# Patient Record
Sex: Female | Born: 1975
Health system: Southern US, Community
[De-identification: ages and names within clinical notes are randomized; demographics above are authoritative.]

## PROBLEM LIST (undated history)

## (undated) DIAGNOSIS — J189 Pneumonia, unspecified organism: Secondary | ICD-10-CM

## (undated) DIAGNOSIS — K5190 Ulcerative colitis, unspecified, without complications: Secondary | ICD-10-CM

## (undated) DIAGNOSIS — Z87442 Personal history of urinary calculi: Secondary | ICD-10-CM

## (undated) DIAGNOSIS — R0600 Dyspnea, unspecified: Secondary | ICD-10-CM

## (undated) DIAGNOSIS — J310 Chronic rhinitis: Secondary | ICD-10-CM

## (undated) DIAGNOSIS — Z8669 Personal history of other diseases of the nervous system and sense organs: Secondary | ICD-10-CM

## (undated) DIAGNOSIS — N3010 Interstitial cystitis (chronic) without hematuria: Secondary | ICD-10-CM

## (undated) DIAGNOSIS — E785 Hyperlipidemia, unspecified: Secondary | ICD-10-CM

## (undated) DIAGNOSIS — J329 Chronic sinusitis, unspecified: Secondary | ICD-10-CM

## (undated) DIAGNOSIS — J40 Bronchitis, not specified as acute or chronic: Secondary | ICD-10-CM

## (undated) DIAGNOSIS — R599 Enlarged lymph nodes, unspecified: Secondary | ICD-10-CM

## (undated) DIAGNOSIS — K219 Gastro-esophageal reflux disease without esophagitis: Secondary | ICD-10-CM

## (undated) DIAGNOSIS — E0590 Thyrotoxicosis, unspecified without thyrotoxic crisis or storm: Secondary | ICD-10-CM

## (undated) HISTORY — DX: Gastro-esophageal reflux disease without esophagitis: K21.9

## (undated) HISTORY — PX: UPPER GASTROINTESTINAL ENDOSCOPY: SHX188

## (undated) HISTORY — PX: BLADDER SURGERY: SHX569

## (undated) HISTORY — DX: Hyperlipidemia, unspecified: E78.5

## (undated) HISTORY — DX: Enlarged lymph nodes, unspecified: R59.9

## (undated) HISTORY — DX: Chronic rhinitis: J31.0

## (undated) HISTORY — DX: Chronic sinusitis, unspecified: J32.9

## (undated) HISTORY — DX: Personal history of other diseases of the nervous system and sense organs: Z86.69

## (undated) HISTORY — DX: Interstitial cystitis (chronic) without hematuria: N30.10

## (undated) HISTORY — PX: TONSILECTOMY, ADENOIDECTOMY, BILATERAL MYRINGOTOMY AND TUBES: SHX2538

## (undated) HISTORY — DX: Bronchitis, not specified as acute or chronic: J40

## (undated) HISTORY — PX: COLONOSCOPY: SHX174

---

## 1997-09-04 ENCOUNTER — Other Ambulatory Visit: Admission: RE | Admit: 1997-09-04 | Discharge: 1997-09-04 | Payer: Self-pay | Admitting: *Deleted

## 1998-09-07 ENCOUNTER — Other Ambulatory Visit: Admission: RE | Admit: 1998-09-07 | Discharge: 1998-09-07 | Payer: Self-pay | Admitting: *Deleted

## 1998-09-24 ENCOUNTER — Ambulatory Visit (HOSPITAL_COMMUNITY): Admission: RE | Admit: 1998-09-24 | Discharge: 1998-09-24 | Payer: Self-pay | Admitting: *Deleted

## 1998-10-25 ENCOUNTER — Inpatient Hospital Stay (HOSPITAL_COMMUNITY): Admission: AD | Admit: 1998-10-25 | Discharge: 1998-10-25 | Payer: Self-pay | Admitting: Obstetrics & Gynecology

## 1999-03-13 ENCOUNTER — Emergency Department (HOSPITAL_COMMUNITY): Admission: EM | Admit: 1999-03-13 | Discharge: 1999-03-14 | Payer: Self-pay | Admitting: Emergency Medicine

## 1999-03-13 ENCOUNTER — Inpatient Hospital Stay (HOSPITAL_COMMUNITY): Admission: AD | Admit: 1999-03-13 | Discharge: 1999-03-13 | Payer: Self-pay | Admitting: Obstetrics & Gynecology

## 1999-09-09 ENCOUNTER — Emergency Department (HOSPITAL_COMMUNITY): Admission: EM | Admit: 1999-09-09 | Discharge: 1999-09-10 | Payer: Self-pay | Admitting: Internal Medicine

## 1999-09-09 ENCOUNTER — Encounter: Payer: Self-pay | Admitting: Internal Medicine

## 1999-09-15 ENCOUNTER — Other Ambulatory Visit: Admission: RE | Admit: 1999-09-15 | Discharge: 1999-09-15 | Payer: Self-pay | Admitting: *Deleted

## 2000-09-27 ENCOUNTER — Other Ambulatory Visit: Admission: RE | Admit: 2000-09-27 | Discharge: 2000-09-27 | Payer: Self-pay

## 2000-11-10 ENCOUNTER — Other Ambulatory Visit: Admission: RE | Admit: 2000-11-10 | Discharge: 2000-11-10 | Payer: Self-pay | Admitting: Obstetrics and Gynecology

## 2000-11-10 ENCOUNTER — Encounter (INDEPENDENT_AMBULATORY_CARE_PROVIDER_SITE_OTHER): Payer: Self-pay

## 2001-03-07 ENCOUNTER — Other Ambulatory Visit: Admission: RE | Admit: 2001-03-07 | Discharge: 2001-03-07 | Payer: Self-pay | Admitting: Obstetrics and Gynecology

## 2001-06-13 HISTORY — PX: DILATION AND CURETTAGE OF UTERUS: SHX78

## 2001-06-19 ENCOUNTER — Other Ambulatory Visit: Admission: RE | Admit: 2001-06-19 | Discharge: 2001-06-19 | Payer: Self-pay | Admitting: Obstetrics and Gynecology

## 2001-10-02 ENCOUNTER — Other Ambulatory Visit: Admission: RE | Admit: 2001-10-02 | Discharge: 2001-10-02 | Payer: Self-pay | Admitting: Orthopedic Surgery

## 2002-03-25 ENCOUNTER — Ambulatory Visit (HOSPITAL_BASED_OUTPATIENT_CLINIC_OR_DEPARTMENT_OTHER): Admission: RE | Admit: 2002-03-25 | Discharge: 2002-03-25 | Payer: Self-pay | Admitting: *Deleted

## 2002-04-22 ENCOUNTER — Other Ambulatory Visit: Admission: RE | Admit: 2002-04-22 | Discharge: 2002-04-22 | Payer: Self-pay | Admitting: Obstetrics and Gynecology

## 2002-10-18 ENCOUNTER — Other Ambulatory Visit: Admission: RE | Admit: 2002-10-18 | Discharge: 2002-10-18 | Payer: Self-pay | Admitting: Obstetrics and Gynecology

## 2003-07-11 ENCOUNTER — Inpatient Hospital Stay (HOSPITAL_COMMUNITY): Admission: RE | Admit: 2003-07-11 | Discharge: 2003-07-13 | Payer: Self-pay | Admitting: Obstetrics and Gynecology

## 2003-08-08 ENCOUNTER — Other Ambulatory Visit: Admission: RE | Admit: 2003-08-08 | Discharge: 2003-08-08 | Payer: Self-pay | Admitting: Obstetrics and Gynecology

## 2004-05-26 ENCOUNTER — Ambulatory Visit (HOSPITAL_COMMUNITY): Admission: RE | Admit: 2004-05-26 | Discharge: 2004-05-26 | Payer: Self-pay | Admitting: Allergy

## 2005-04-06 ENCOUNTER — Ambulatory Visit: Payer: Self-pay | Admitting: Pulmonary Disease

## 2005-04-08 ENCOUNTER — Ambulatory Visit: Payer: Self-pay | Admitting: Cardiology

## 2005-04-29 ENCOUNTER — Ambulatory Visit: Payer: Self-pay | Admitting: Pulmonary Disease

## 2005-05-27 ENCOUNTER — Ambulatory Visit: Payer: Self-pay | Admitting: Internal Medicine

## 2005-05-31 ENCOUNTER — Ambulatory Visit: Payer: Self-pay | Admitting: Pulmonary Disease

## 2005-07-01 ENCOUNTER — Ambulatory Visit: Payer: Self-pay | Admitting: Emergency Medicine

## 2005-08-29 ENCOUNTER — Ambulatory Visit: Payer: Self-pay | Admitting: Internal Medicine

## 2005-11-14 ENCOUNTER — Ambulatory Visit: Payer: Self-pay | Admitting: Pulmonary Disease

## 2005-11-24 ENCOUNTER — Ambulatory Visit: Payer: Self-pay | Admitting: Gastroenterology

## 2006-04-14 ENCOUNTER — Ambulatory Visit: Payer: Self-pay | Admitting: Pulmonary Disease

## 2006-04-28 ENCOUNTER — Ambulatory Visit: Payer: Self-pay | Admitting: Pulmonary Disease

## 2006-07-28 ENCOUNTER — Ambulatory Visit: Payer: Self-pay | Admitting: Pulmonary Disease

## 2007-09-13 ENCOUNTER — Telehealth (INDEPENDENT_AMBULATORY_CARE_PROVIDER_SITE_OTHER): Payer: Self-pay | Admitting: *Deleted

## 2007-09-27 ENCOUNTER — Encounter: Payer: Self-pay | Admitting: Adult Health

## 2007-09-27 DIAGNOSIS — T7840XA Allergy, unspecified, initial encounter: Secondary | ICD-10-CM | POA: Insufficient documentation

## 2007-09-27 DIAGNOSIS — J45909 Unspecified asthma, uncomplicated: Secondary | ICD-10-CM | POA: Insufficient documentation

## 2007-10-02 ENCOUNTER — Telehealth (INDEPENDENT_AMBULATORY_CARE_PROVIDER_SITE_OTHER): Payer: Self-pay | Admitting: *Deleted

## 2007-10-02 ENCOUNTER — Ambulatory Visit: Payer: Self-pay | Admitting: Internal Medicine

## 2008-07-26 ENCOUNTER — Ambulatory Visit (HOSPITAL_COMMUNITY): Admission: RE | Admit: 2008-07-26 | Discharge: 2008-07-26 | Payer: Self-pay | Admitting: Orthopedic Surgery

## 2010-04-13 ENCOUNTER — Ambulatory Visit (HOSPITAL_COMMUNITY): Admission: RE | Admit: 2010-04-13 | Discharge: 2010-04-13 | Payer: Self-pay | Admitting: Allergy

## 2010-07-15 NOTE — Progress Notes (Signed)
Summary: req rx.Marland KitchenMarland KitchenMarland KitchenMarland Kitchenrequested chart...  Phone Note Call from Patient   Caller: Patient Call For: tammy parrett Summary of Call: pt req xopenex inhaler. almost out. no refills left.  [facility] [address] pt [phone]. Initial call taken by: Cooper Render,  September 13, 2007 9:37 AM  Follow-up for Phone Call        chart not preloaded...Marland KitchenMarland KitchenMarland Kitchenrequested chart....awaiting chart before addressing this message.  Follow-up by: Valrie Hart LPN,  September 12, 8848 10:02 AM  Additional Follow-up for Phone Call Additional follow up Details #1::        spoke with pt. pt is a former pt of dr. Patsey Berthold and has not been seen in our office since feb 2008. scheduled pt a consult appt with dr. Chase Caller for 10-02-2007 @ 10:40 to re-establish with practice. called in x[facility]ex hfa [facility] [address] with one refill only.  pt aware.  Additional Follow-up by: Valrie Hart LPN,  September 12, 2772 10:48 AM    New/Updated Medications: XOPENEX HFA 45 MCG/ACT  AERO (LEVALBUTEROL TARTRATE) inhale 1 to 2 puffs every 6 hours as needed   Prescriptions: XOPENEX HFA 45 MCG/ACT  AERO (LEVALBUTEROL TARTRATE) inhale 1 to 2 puffs every 6 hours as needed  #1 x 0   Entered by:   Valrie Hart LPN   Authorized by:   Rexene Edison NP   Signed by:   Valrie Hart LPN on 12/87/8676   Method used:   Telephoned to ...       [facility]       [place], [place]  [address]       Ph: [phone]       Fax: [phone]   RxID:   [id number]

## 2010-07-15 NOTE — Assessment & Plan Note (Signed)
Summary: former pt of dr. Sharyne Peach with practice./mg   Visit Type:  Follow-up PCP:  Dr Samara Snide  Chief Complaint:  asthmapulmonary consult.....Marland Kitchenasthma......Marland Kitchenprev pt of dr. Duwayne Heck.  History of Present Illness: 35 year old female. Used to be followed up by Dr. Veda Canning for asthma. Now here because she wants her inhalers refilled.   Has had long standing asthma (see past med). Lsat prednisone course 07/2007 and returned to baseline.  However, for past 1 month more dyspneic and using rescure now once a day. Waking up at least once a night and using albuerol for rescue.      Updated Prior Medication List: XOPENEX HFA 45 MCG/ACT  AERO (LEVALBUTEROL TARTRATE) inhale 1 to 2 puffs every 6 hours as needed ZYRTEC ALLERGY 10 MG  TABS (CETIRIZINE HCL) once daily PHENTERMINE HCL 15 MG  CAPS (PHENTERMINE HCL) once daily  Current Allergies (reviewed today): ! * LATEX ALLERGY  Past Medical History:    Denies Vocal Cord Dysfunction    MIGRAINES, HX OF (ICD-V13.8)    ALLERGY (ICD-995.3) x Hay Fever    ASTHMA (ICD-493.90) x since age 18. Mulitple ER visits. Last ER visit 2002. Last [facility] visit 2008. Been to Urgent care 5 times since 2006. 10 x 2 week pred bursts since 2006. Last pred course 07/2007. No hospitalization. No ICU admits. Between exacerbations typically uses albuterol uses 2 times per week. Always gets 3-4 exacerbations per year. Exacerbations provoked by weather change, cold, uri, sore throat, dust, pollen, some perfumes, fuel, smog, burning brush, exercise. Typical symptoms of asthma are dysponea, chest tightness and occassional wheeze. Medication regimen as of 09/2007 does not inlcude LABA and ICS. Only PFTs done was at age 30 at 70.        Family History:    COPD--grandfather    allergies--grandfather    asthma--grandfather    cancer--grandmother  Social History:    married and have 2 children    lives with husband and children    1 dog (poodle). No cats. 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    Risk Factors:  Tobacco use:  never   Review of Systems  The patient denies anorexia, fever, weight loss, weight gain, vision loss, decreased hearing, hoarseness, chest pain, syncope, dyspnea on exhertion, peripheral edema, prolonged cough, hemoptysis, abdominal pain, melena, hematochezia, severe indigestion/heartburn, hematuria, incontinence, genital sores, muscle weakness, suspicious skin lesions, transient blindness, difficulty walking, depression, unusual weight change, abnormal bleeding, enlarged lymph nodes, angioedema, breast masses, and testicular masses.         occ clearning of throat Normal menstrual cycles, Denies pregnancy   Vital Signs:  Patient Profile:   35 Years Old Female Height:     65 inches Weight:      156 pounds BMI:     26.05 O2 Sat:      98 % O2 treatment:    Room Air Temp:     99.9 degrees F oral Pulse rate:   100 / minute BP sitting:   110 / 70  (left arm) Cuff size:   regular  Pt. in pain?   no  Vitals Entered By: Marcy Siren Deborra Medina) (October 02, 2007 10:50 AM)                  Physical Exam  General:     well developed, well nourished, in no acute distress Head:     normocephalic and atraumatic Eyes:  PERRLA/EOM intact; conjunctiva and sclera clear Ears:     TMs intact and clear with normal canals Nose:     no deformity, discharge, inflammation, or lesions Mouth:     no deformity or lesions Neck:     no masses, thyromegaly, or abnormal cervical nodes Chest Wall:     no deformities noted Lungs:     clear bilaterally to auscultation and percussion Heart:     regular rate and rhythm, S1, S2 without murmurs, rubs, gallops, or clicks Abdomen:     bowel sounds positive; abdomen soft and non-tender without masses, or organomegaly Msk:     no deformity or scoliosis noted with normal posture Pulses:     pulses normal Extremities:     no clubbing,  cyanosis, edema, or deformity noted Neurologic:     CN II-XII grossly intact with normal reflexes, coordination, muscle strength and tone Skin:     intact without lesions or rashes Cervical Nodes:     no significant adenopathy Psych:     alert and cooperative; normal mood and affect; normal attention span and concentration     Problem # 1:  ASTHMA (ICD-493.90) Assessment: New 35 year old non smoker with diagnosis of asthma since age 51. Has all classic trigger factors. High Risk asthma as characterized by multiple [place] visits, > 3-4 exacerbations per year needing prednsione. Between exacerbations is NOT on ICS/LABA treatment. Bsaeline lung fucntion unknown. Currently, has symptoms in the "NOT WELL CONTROLLED" class. However, office Spirometry is NORMAL.   Suspect Overperceiver, ? has asthma, ? normal spirometry is poor for her.  PLAN Methacholine challenge test (denies prengancy)   Medications Added to Medication List This Visit: 1)  Zyrtec Allergy 10 Mg Tabs (Cetirizine hcl) .... Once daily 2)  Phentermine Hcl 15 Mg Caps (Phentermine hcl) .... Once daily   Patient Instructions: 1)  Have methacholine challenge test 2)  Return for followup after that 3)  You cannot be pregnant for this test 4)  Till then take xopenex as needed 5)  If any acout worsening, please come to urgen care at our office for spirometry    ]  SPIROMETRY RESULTS  Date:10/02/2007 height inches: 65 Gender: Female  Parameter  Measured Predicted %Predicted  FVC      4.11        3.84      107  FEV1      3.29        3.27      101  FEV1/FVC      0.80        0.85      94  FEF25-75      3.11        3.78      82  PEF              446      0  Post-Bronchodilator  Parameter Measured Change from Baseline  FVC               FEV1               FEV1/FVC                FEF25-75                PEF              Normal Flow-Volume loop.    Pulse Oximetry Pulse: 100 O2 Saturation %: 98

## 2010-07-15 NOTE — Progress Notes (Signed)
Summary: NEED TO SPEAK TO NURSE ABOUT OV TODAY AND REFILLS  Phone Note Call from Patient   Caller: Patient Call For: PARRETT Summary of Call: WANT TO SPEAK TO TAMMY ABOUT OV APPOINTMENT TODAY AND HER REFILLS PATIENT'S CHART HAS BEEN REQUESTED   Initial call taken by: Gustavus Bryant,  October 02, 2007 3:14 PM  Follow-up for Phone Call        Spoke with pt. She was very upset that she was told today that she may not even have asthma.  She states that she has been a chronic asthmatic since she was 35 yrs old and she does not think that she needs MCT.  She is refusing to have test done and would like to switch Dr.s. Please advise. Thanks :) Follow-up by: Tilden Dome,  October 02, 2007 3:45 PM  Additional Follow-up for Phone Call Additional follow up Details #1::        I told her that asthma is still possible but might not be possible as she could have outgrown it as well. Her spirometry in face of lot of symptoms were normal in office yesterday. Before committing to lot of inahler treatment I thought it would be better for her to have methacholine challenge test. I just cannot commit her to lot of inhaler therapy in faceo of normal spirometry. We need better proof. ...................................................................Brand Males MD  October 03, 2007 2:26 PM  Additional Follow-up by: Brand Males MD,  October 03, 2007 2:26 PM    Additional Follow-up for Phone Call Additional follow up Details #2::    I just called her home phone to personally talk and clarify. Have asked her to call back. Please page me so I can talk to her and clarify. If she still wants to switch and does not want to talk to me that is fine. ...................................................................Brand Males MD  October 03, 2007 2:29 PM  Follow-up by: Brand Males MD,  October 03, 2007 2:29 PM  Additional Follow-up for Phone Call Additional follow up Details #3:: Details for Additional  Follow-up Action Taken: one option to see anothe rpulmonologist is Dr. Katherine Roan in town [place]  Oct 12, 2007 2:52 PM  no call back since 10/02/07   Additional Follow-up by: Brand Males MD,  October 04, 2007 9:25 AM

## 2010-09-13 ENCOUNTER — Other Ambulatory Visit (HOSPITAL_COMMUNITY): Payer: Self-pay | Admitting: Family Medicine

## 2010-09-13 DIAGNOSIS — R1013 Epigastric pain: Secondary | ICD-10-CM

## 2010-09-13 DIAGNOSIS — R110 Nausea: Secondary | ICD-10-CM

## 2010-09-14 ENCOUNTER — Ambulatory Visit (HOSPITAL_COMMUNITY)
Admission: RE | Admit: 2010-09-14 | Discharge: 2010-09-14 | Disposition: A | Discharge status: Home / Self Care | Payer: 59 | Source: Ambulatory Visit | Attending: Family Medicine | Admitting: Family Medicine

## 2010-09-14 DIAGNOSIS — R110 Nausea: Secondary | ICD-10-CM | POA: Insufficient documentation

## 2010-09-14 DIAGNOSIS — R1013 Epigastric pain: Secondary | ICD-10-CM | POA: Insufficient documentation

## 2010-10-26 ENCOUNTER — Encounter: Payer: Self-pay | Admitting: Internal Medicine

## 2010-10-26 ENCOUNTER — Ambulatory Visit (INDEPENDENT_AMBULATORY_CARE_PROVIDER_SITE_OTHER): Payer: Commercial Managed Care - PPO | Admitting: Internal Medicine

## 2010-10-26 VITALS — BP 110/68 | HR 64 | Ht 65.0 in | Wt 157.2 lb

## 2010-10-26 DIAGNOSIS — R1013 Epigastric pain: Secondary | ICD-10-CM | POA: Insufficient documentation

## 2010-10-26 DIAGNOSIS — R112 Nausea with vomiting, unspecified: Secondary | ICD-10-CM | POA: Insufficient documentation

## 2010-10-26 NOTE — Progress Notes (Signed)
Subjective:    Patient ID: [name], female    DOB: [birth year], 35 y.o.   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 35 years or more, she has had episodic achy epigastric pain followed by nausea and vomiting. It usually a nocturnal problem. Despite waiting several hours before going to bed, if she eats spicy or very flavorful foods she will experience epigastric aching and subsequent nausea and vomiting. She does not seem to vomit undigested food. It is not associated with her chronic migraines. She had an EGD in 2006 that was normal. She's had 2 ultrasounds in the past several years that are normal. She uses metoclopramide intermittently with partial benefit. She uses it several times a month and she is aware of the potential side effects. He has not lost weight. Bowel habits are regular. It does not seem to be related to her interstitial cystitis problems either. She does describe a fair amount of early satiety issues and says she does not eat much. She has not using it now there is a history of phentermine use  Past Medical History  Diagnosis Date  . GERD (gastroesophageal reflux disease)   . History of migraine headaches   . Rhinosinusitis   . Asthma   . Bronchitis   . Interstitial cystitis   . Hyperlipidemia    Past Surgical History  Procedure Date  . Dilation and curettage of uterus 2003  . Cesarean section [id number]    x2  . Upper gastrointestinal endoscopy 05/11/2005    Normal - Dr. Evette Cristal    reports that she has never smoked. She has never used smokeless tobacco. She reports that she does not drink alcohol or use illicit drugs. family history includes Allergies in an unspecified family member; Asthma in her maternal grandfather; Breast cancer in her maternal grandmother; COPD in an unspecified family member; Heart disease in her maternal grandmother; and Lymphoma in her maternal grandmother. Allergies  Allergen Reactions    . Latex       Current outpatient prescriptions:azelastine (ASTEPRO) 137 MCG/SPRAY nasal spray, 1 spray by Nasal route 2 (two) times daily. Use in each nostril as directed , Disp: , Rfl: ;  budesonide-formoterol (SYMBICORT) 160-4.5 MCG/ACT inhaler, Inhale 2 puffs into the lungs 2 (two) times daily.  , Disp: , Rfl: ;  hydrOXYzine (ATARAX) 25 MG tablet, Take 25 mg by mouth 3 (three) times daily as needed.  , Disp: , Rfl:  levalbuterol (XOPENEX) 1.25 MG/3ML nebulizer solution, Take 1 ampule by nebulization every 4 (four) hours as needed.  , Disp: , Rfl: ;  Loratadine (CLARITIN) 10 MG CAPS, Take 1 capsule by mouth daily.  , Disp: , Rfl: ;  metoCLOPramide (REGLAN) 10 MG tablet, Take 10 mg by mouth as needed.  , Disp: , Rfl: ;  mometasone (NASONEX) 50 MCG/ACT nasal spray, 2 sprays by Nasal route daily.  , Disp: , Rfl:  montelukast (SINGULAIR) 10 MG tablet, Take 10 mg by mouth at bedtime.  , Disp: , Rfl: ;  pantoprazole (PROTONIX) 40 MG tablet, Take 40 mg by mouth daily.  , Disp: , Rfl:    Review of Systems Allergies and sinus problems, asthma though this appears stable and controlled, interstitial cystitis with intermittent bladder symptoms. All the review of systems negative.    Objective:   Physical Exam General: Well-developed, well-nourished and in no acute distress Eyes:anicteric. Mouth and posterior pharynx: normal.  Neck: supple w/o thyromegaly or mass.  Lungs: clear.  Heart: S1S2, no rubs, murmurs, gallops. Abdomen: soft, non-tender, no hepatosplenomegaly, hernia, or mass and BS+. No splash. Lymphatics: no cervical,or [place]  nodes. Extremities:  no edema Skin no rash. Neuro: nonfocal. A&O x 3.  Psych: appropriate mood and  affect.       Assessment & Plan:

## 2010-10-26 NOTE — Assessment & Plan Note (Addendum)
Chronic recurrent achy epigastric pain despite PPI. EGD normal in 2006. This proceeds her nausea and vomiting episodes. She is well in between. I am most suspicious of a functional GI process. Await the gastric emptying study.

## 2010-10-26 NOTE — Assessment & Plan Note (Signed)
This is a chronic recurrent issue episodic. It sounds like she has a gastric emptying disturbance. EGD in 2006 was negative, ultrasound then and now is unrevealing as well. She also has significant early satiety. We'll start with a gastric emptying study. Consider EGD with biopsies given her allergy issues, she could have eosinophilic gastroenteritis that seems less likely. It does not seem to have a relationship with her migraines.

## 2010-10-26 NOTE — Patient Instructions (Addendum)
Do not take hydroxyzine, pantoprazole or metoclopramide 24 hours prior to your gastric emptying study. You have been scheduled for a Gastric Emptying Scan at [facility] June 4th at 8:00am arrive at 7:45am. Nothing to eat or drink after midnight the night before.

## 2010-10-29 NOTE — Op Note (Signed)
NAMEEUSTOLIA, DRENNEN                        ACCOUNT NO.:  [id number]   MEDICAL RECORD NO.:  [id number]                   PATIENT TYPE:  INP   LOCATION:  3300                                 FACILITY:  [facility]   PHYSICIAN:  Lovenia Kim, M.D.             DATE OF BIRTH:  1976/05/15   DATE OF PROCEDURE:  07/11/2003  DATE OF DISCHARGE:                                 OPERATIVE REPORT   PREOPERATIVE DIAGNOSES:  1. Thirty-nine week intrauterine pregnancy.  2. Previous cesarean section, for repeat.   POSTOPERATIVE DIAGNOSES:  1. Thirty-nine week intrauterine pregnancy.  2. Previous cesarean section, for repeat.   PROCEDURE:  Repeat low segment transverse cesarean section.   SURGEON:  Lovenia Kim, M.D.   ASSISTANT:  Freddie Apley, M.D.   ANESTHESIA:  Spinal by Loren Racer. Charlean Merl, M.D.   ESTIMATED BLOOD LOSS:  1000 mL.   COMPLICATIONS:  None.   DRAINS:  Foley.   COUNTS:  Correct.   Patient to recovery in good condition.   FINDINGS:  Normal tubes, normal ovaries, anterior placenta.  Normal previous  uterine scar.  An 8 pound 2 ounce female, Apgars 8 and 9, pediatrics in  attendance.  Placenta delivered manually intact.   BRIEF OPERATIVE NOTE:  After being apprised of the risks of anesthesia,  infection, bleeding, injury to abdominal organs and need for repair, the  patient was brought to the operating room, where she was administered a  spinal anesthetic without complications, prepped and draped in the usual  sterile fashion, and a Foley catheter placed.  After achieving adequate  anesthesia, dilute Marcaine solution placed in the previous skin incision  area and the skin incision made with a scalpel, carried down to the fascia,  which was nicked in the midline and opened transversely using Mayo scissors.  Rectus muscles dissected sharply in the midline, peritoneum entered sharply,  bladder blade placed.  The visceral peritoneum was scored in a smiling  fashion and  dissected sharply off the lower uterine segment.  The uterus  scored in a smiling fashion.  Bloody transplacental passages made to clear  amniotic fluid.  A full-term living female delivered from occiput transverse  position, Apgars 8 and 9.  Placenta delivered manually intact, three-vessel  cord.  Cord clamped and cut.  Neonate handed to the pediatrician.  At this  time the uterus was exteriorized.  It was atonic.  Extra IV Pitocin is added  in addition to one dose of IM Methergine.  The uterus is then closed in two  layers using a 0 Monocryl suture.  Normal tubes and ovaries noted.  The  uterus is replaced.  The paracolic gutters are irrigated, all blood clot  subsequently removed.  The fascia then closed with a 0 Monocryl in a  continuous running fashion, the skin closed using staples.  The patient  tolerates the procedure well and is transferred to recovery in good  condition.                                               Lovenia Kim, M.D.    RJT/MEDQ  D:  07/11/2003  T:  07/11/2003  Job:  [id number]

## 2010-10-29 NOTE — Assessment & Plan Note (Signed)
[name]                             PULMONARY OFFICE NOTE   NAME:[name], [name]                     MRN:          [id number]  DATE:07/28/2006                            DOB:          1976-06-11    HISTORY OF PRESENT ILLNESS:  The patient is a 35 year old white female,  patient of Dr. Patsey Berthold, who has a history of reactive airways with  underlying gastroesophageal reflux with a laryngopharyngeal component  who presents for a 5-day history of nasal congestion, productive cough  of thick green sputum and intermittent wheezing.  Patient denies any  hemoptysis, orthopnea, PND or leg swelling.   PAST MEDICAL HISTORY:  Reviewed.   CURRENT MEDICATIONS:  Reviewed.   PHYSICAL EXAMINATION:  Patient is a pleasant female, in no acute  distress.  She is afebrile with stable vital signs.  Her 02 saturation is 98% on  room air.  HEENT:  Nasal mucosa with some mild redness.  Non-tender sinuses.  NECK:  Supple without adenopathy.  No JVD.  LUNG SOUNDS:  Reveal a few scattered wheezes.  CARDIAC:  Regular rate and rhythm.  ABDOMEN:  Soft and non-tender.  EXTREMITIES:  Warm without any edema.   IMPRESSION AND PLAN:  Acute upper respiratory infection.  Patient is  given Omnicef x5 days and Mucinex DM twice daily.  Patient may use Indol  HC 1 to 2 teaspoons every 4 to 6 hours as needed for cough.  Patient may  use her albuterol inhaler as needed.  Patient is to return back with Dr.  Patsey Berthold in 1 month, or sooner if needed.      Rexene Edison, NP  Electronically Signed      Renold Don, MD  Electronically Signed   TP/MedQ  DD: 08/01/2006  DT: 08/01/2006  Job #: [id number]

## 2010-10-29 NOTE — Discharge Summary (Signed)
[name], [name]                        ACCOUNT NO.:  [id number]   MEDICAL RECORD NO.:  [id number]                   PATIENT TYPE:  INP   LOCATION:  7972                                 FACILITY:  [facility]   PHYSICIAN:  Lovenia Kim, M.D.             DATE OF BIRTH:  1975-12-02   DATE OF ADMISSION:  07/11/2003  DATE OF DISCHARGE:  07/13/2003                                 DISCHARGE SUMMARY   The patient underwent an uncomplicated repeat cesarean section on July 11, 2003.  Postoperative course was uncomplicated.  Discharged to home on  day two.  Discharge teaching done.  Follow up in the office in four to six  weeks.                                               Lovenia Kim, M.D.    RJT/MEDQ  D:  08/10/2003  T:  08/10/2003  Job:  [id number]

## 2010-10-29 NOTE — Assessment & Plan Note (Signed)
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 She presents today with a 2 week history of  cough which initially started as a dry cough and now has progressed to  productive with greenish sputum. The patient denies any fevers, chills or  sweats but has had generalized malaise. The patient denies any other  symptomatology. She has had no chest pain, no nausea, no vomiting.   CURRENT MEDICATIONS:  As noted on the intake sheet. These have been reviewed  and are accurate. She is currently on Protonix once a day.   PHYSICAL EXAMINATION:  VITAL SIGNS:  Oxygen saturation was 99% on room air.  GENERAL:  This is a well-developed, ambulatory female who is in no acute  distress.  HEENT:  Remarkable for mild pharyngeal erythema. She also has mild rhinitis  changes with turbinate edema.  NECK:  Supple, no adenopathy noted, no JVD.  LUNGS:  Clear to auscultation bilaterally. She is moving air well.  CARDIAC:  Regular rate and rhythm, no rubs, murmurs or gallops.  EXTREMITIES:  The patient has no cyanosis, no clubbing and no edema noted.   IMPRESSION:  1. Cough aggravated by acute tracheobronchitis.  2. Acute tracheobronchitis.  3. Gastroesophageal reflux with laryngopharyngeal component.   PLAN:  1. Patient to be placed on an azithromycin Z-Pak.  2. Will place the patient on Protonix 40 mg twice a day.  3. The patient will Endal HD on a p.r.n. basis.  4. Also recommended that she take Mucinex DM 2 tablets twice a day.  5. Followup will be in 1-2 weeks time with our nurse practitioner, Tammy      Parrett. Otherwise, she is to  contact us prior to that time should any      problems arise.     Renold Don, MD  Electronically Signed    CLG/MedQ  DD: 04/14/2006  DT: 04/14/2006  Job #: [id number]

## 2010-10-29 NOTE — Assessment & Plan Note (Signed)
[name]                               PULMONARY OFFICE NOTE   NAME:[name]                     MRN:          [id number]  DATE:04/28/2006                            DOB:          [birth year]    HISTORY OF PRESENT ILLNESS:  Patient is a 35 year old white female patient  of Dr. Patsey Berthold who has a known history of reactive airway with underlying  gastroesophageal reflux with laryngopharyngeal component.  The patient was  seen approximately two weeks ago for acute tracheal bronchitis.  Treated  with a Z pack, Mucinex DM, and Endal HD.  Patient reports that she has only  had minimal improvement in symptoms.  Continues to have productive cough,  nasal congestion with thick green discharge and wheezing.  Patient also  complains of severe coughing paroxysms.  Patient denies any hemoptysis,  chest pain, orthopnea, PND, or leg swelling.   PAST MEDICAL HISTORY:  Reviewed.   CURRENT MEDICATIONS:  Reviewed.   PHYSICAL EXAMINATION:  GENERAL:  Patient is a pleasant female in no acute  distress.  VITAL SIGNS:  She is afebrile with stable vital signs.  Saturation is 97% on  room air.  HEENT:  Nasal mucosa is erythematous bilaterally.  Maxillary sinus  tenderness to percussion.  Posterior pharynx is clear.  NECK:  Supple without adenopathy.  LUNGS:  The lung sounds reveal coarse breath sounds bilaterally with a few  scattered expiratory wheezes.  CARDIAC:  Regular rate.  ABDOMEN:  Soft.  EXTREMITIES:  Warm without any edema.   IMPRESSION/PLAN:  Acute rhinosinusitis and bronchitis with an asthmatic  bronchitic flare.  Patient is to begin Omnicef x10 days, add in Mucinex DM  twice daily.  A Xopenex nebulizer treatment was given in the office.  She  will begin her nasal hygiene regimen with saline, Afrin, and Nasonex.  She  will also begin a prednisone taper over the next week and may continue to  use her Endal HD #8 ounces 1-2 teaspoons every 4-6  hours as needed for  cough.  Patient is to return back with Dr. Patsey Berthold as scheduled or sooner  if needed.     Rexene Edison, NP  Electronically Signed      Renold Don, MD  Electronically Signed   TP/MedQ  DD: 04/28/2006  DT: 04/28/2006  Job #: [id number]

## 2010-10-29 NOTE — H&P (Signed)
NAME:  [name], [name]                          ACCOUNT NO.:  [id number]   MEDICAL RECORD NO.:  [id number]                   PATIENT TYPE:   LOCATION:                                       FACILITY:   PHYSICIAN:  Katha Cabal., M.D.         DATE OF BIRTH:  Dec 21, 1975   DATE OF ADMISSION:  DATE OF DISCHARGE:                                HISTORY & PHYSICAL   REASON FOR ADMISSION:  This 35 year old female is scheduled for  cysto/hydrodistention with Clorpactin irrigation of the bladder March 25, 2002, at [facility] with chief complaint of painful frequent  urination, onset August of 2003.   HISTORY OF PRESENT ILLNESS:  This 35 year old female with no previous  history of stones or urinary tract infections was noted to have a urinary  tract infection by her gynecologist in August of 2003 and was treated with  Cipro followed by Macrobid.  She had urgency, frequency and dysuria and  these symptoms have not improved on the antibiotics.  She was initially seen  in our office and had a culture performed on March 14, 2002, which showed  less than 10,000 colonies of multiple species.  She called back on March 18, 2002, stating that she was not any better, still having frequency every  one to two hours with a lot of urgency, was not getting up at night, had  passed no growth, blood, gravel or stone.  She brought in another urine  which showed 6 to 10  white cells, occasional red cell and a culture was  repeated.   ALLERGIES:  LATEX.   CURRENT MEDICATIONS:  Ventolin, Advair, Singulair.  She has completed her  Tequin and is on Macrobid and she has been taking prednisone for flare up of  her asthma.   PAST MEDICAL HISTORY:  This reveals that she has had asthma and she had a C-  section in 1998.   FAMILY HISTORY:  This reveals that there is high blood pressure in the  family and some of the older grandparents have had cancers.  Her father is  78, mother 13,  living and well.  She has one sibling, age 30, who is in good  health.   SOCIAL HISTORY:  Her spouse, age 74, is in good health.  She has two  children, living and well.  The patient does not abuse alcohol or tobacco.  She works at [employer] as a Quarry manager.   REVIEW OF SYSTEMS:  General health has been good. HEENT:  Unremarkable.  CARDIORESPIRATORY:  She has had some flare up of her asthma, shortness of  breath, and cough recently but this is improving on the Tequin and the  prednisone.  She has not had any chest pain or heart attack to her  knowledge.  GI:  Unremarkable.  Occasionally has some nausea.  BONES,  JOINTS, AND MUSCLES:  Unremarkable.  UROLOGIC:  Unremarkable.  SKIN:  No  lesions.  LYMPHATIC:  No nodes.  OB/GYN:  She did have a recent miscarriage  about three weeks ago.   PHYSICAL EXAMINATION:  GENERAL APPEARANCE:  A well-developed, well-  nourished, anxious 35 year old female.  VITAL SIGNS:  Temperature 98.2, pulse 72, respiratory rate 16, blood  pressure 108/59.  HEENT:  Ears and tympanic membranes unremarkable.  Eyes react normally to  light and accommodation.  Extraocular movements intact.  Pharynx benign.  Teeth in good repair.  NECK:  No enlargement of thyroid.  No nodes felt.  CHEST:  Clear to percussion and auscultation.  CARDIOVASCULAR:  Normal sinus rhythm, no murmur.  ABDOMEN:  Flat with suprapubic tenderness, low transverse scar.  Liver,  spleen, kidney, mass, or hernia not detected.  PELVIC:  Examination done recently by her GYN doctor who did notice that her  bladder was tender.  EXTREMITIES:  No edema.  Good pulses.  NEUROLOGIC:  Grossly normal reflexes and sensation.  LYMPHATIC:  No nodes.  SKIN:  No lesions.   DIAGNOSES:  1. Urinary tract infection.  2. Suspect interstitial cystitis four weeks' duration.  3. Asthma on prednisone.  4. Recent miscarriage and upper respiratory infection.   PLAN:  Will schedule cysto/hydrodistention  and Clorpactin irrigation of the  bladder at [facility].  Will try for March 25, 2002.                                                Katha Cabal., M.D.    Tera Mater  D:  03/18/2002  T:  03/18/2002  Job:  [id number]

## 2010-10-29 NOTE — Op Note (Signed)
   NAME:  [name], [name]                        ACCOUNT NO.:  [id number]   MEDICAL RECORD NO.:  [id number]                   PATIENT TYPE:  AMB   LOCATION:  [facility]                                 FACILITY:  [facility]   PHYSICIAN:  Raelyn Mora., MD              DATE OF BIRTH:  [birth year]   DATE OF PROCEDURE:  DATE OF DISCHARGE:                                 OPERATIVE REPORT   PREOPERATIVE DIAGNOSES:  1. Interstitial cystitis  2. Recurrent urinary tract infections.  3. Asthma.  4. Recent miscarriage and upper respiratory infection.   POSTOPERATIVE DIAGNOSES:  1. Interstitial cystitis  2. Recurrent urinary tract infections.  3. Asthma.  4. Recent miscarriage and upper respiratory infection.   OPERATION PERFORMED:  Cystoscopy, hydrodistention and Clorpactin irrigation  of bladder.   DESCRIPTION OF PROCEDURE:  This 35 year old female was brought to the  operating room prepared for surgery with 80 mg IV gentamycin and underwent  successful induction of general anesthesia. She is prepped and draped in  lithotomy position and the urethra was dilated 28-32 and the urethra was  snug. A culture was obtained. the bladder was then inspected with a #22  cystourethroscope using 70 and 12 degree lenses and was noted to have  initially no stone, tumor nor ulcer in the bladder. The right and left  ureteral orifices were normal. The bladder was distended to capacity which  was only 350 ml and she developed moderately severe hemorrhages both large  and small. These were more marked over the trigone and peritrogonal areas  but to some extent were present on the anterior and lateral walls as well.  The patient's bladder was emptied, she was irrigated with 1000 cc of  Clorpactin solution and returned to the recovery room with an empty bladder.  Examination under anesthesia revealed a slightly enlarged uterus but no  other masses. Bladder support was good, rectal tone was good and no rectal  masses were noted.   Plan is for the patient to go home. She will have Vicodin for pain and she  will finish her Biaxin which she is on for upper respiratory infection and  use Macrobid for prophylaxis against urinary tract infections. See [place] in the  office on Friday. Avoid caffeine and alcohol.                                                Raelyn Mora., MD    HB/MEDQ  D:  03/25/2002  T:  03/25/2002  Job:  [id number]

## 2010-10-29 NOTE — H&P (Signed)
[name], [name]                        ACCOUNT NO.:  [id number]   MEDICAL RECORD NO.:  [id number]                   PATIENT TYPE:  INP   LOCATION:  NA                                   FACILITY:  [place]   PHYSICIAN:  Lovenia Kim, M.D.             DATE OF BIRTH:  07-05-1975   DATE OF ADMISSION:  07/12/2003  DATE OF DISCHARGE:                                HISTORY & PHYSICAL   CHIEF COMPLAINT:  Elective repeat cesarean section.   HISTORY OF PRESENT ILLNESS:  The patient is a 35 year old white female G4,  P1 at [redacted] weeks gestation for elective repeat cesarean section.   ALLERGIES:  LATEX.   MEDICATIONS:  Prenatal vitamins and Valtrex.   PAST MEDICAL HISTORY:  History of asthma. 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 History of interstitial cystitis.   PAST SURGICAL HISTORY:  History of tonsillectomy. History of hydro-  distention of her bladder.   FAMILY HISTORY:  Chronic hypertension, diabetes, thyroid dysfunction,  lymphoma, and breast cancer.   PREVIOUS OBSTETRIC HISTORY:  TAB x1. SAB x1. Cesarean section in 1998.   PRENATAL LABORATORY DATA:  Blood type of A+, Rh antibody negative, Rubella  immune, hepatitis and HIV negative.   PHYSICAL EXAMINATION:  GENERAL:  A well developed, well nourished white  female in no acute distress.  HEENT:  Normal.  LUNGS:  Clear.  HEART:  Regular rate and rhythm.  ABDOMEN:  Soft, gravid, nontender. Estimated fetal weight 8 and 1/2 pounds.  PELVIC:  Cervix is 2 cm, 50% vertex and minus 1.  EXTREMITIES:  No cords.  NEUROLOGIC:  Examination is non-focal.   IMPRESSION:  1. A 39 week intrauterine pregnancy.  2. Previous cesarean section, repeat.   PLAN:  Elective repeat cesarean section. Risks of anesthesia, infection,  bleeding, injury to abdominal organs were discussed today plus immediate  complications to include bowel or bladder injury are noted. The patient  acknowledges and wishes to proceed.                                  Lovenia Kim, M.D.    RJT/MEDQ  D:  07/10/2003  T:  07/10/2003  Job:  [id number]

## 2010-11-15 ENCOUNTER — Encounter (HOSPITAL_COMMUNITY)
Admission: RE | Admit: 2010-11-15 | Discharge: 2010-11-15 | Disposition: A | Discharge status: Home / Self Care | Payer: 59 | Source: Ambulatory Visit | Attending: Internal Medicine | Admitting: Internal Medicine

## 2010-11-15 ENCOUNTER — Encounter (HOSPITAL_COMMUNITY): Payer: Self-pay

## 2010-11-15 DIAGNOSIS — R1013 Epigastric pain: Secondary | ICD-10-CM | POA: Insufficient documentation

## 2010-11-15 DIAGNOSIS — R112 Nausea with vomiting, unspecified: Secondary | ICD-10-CM | POA: Insufficient documentation

## 2010-11-15 MED ORDER — TECHNETIUM TC 99M SULFUR COLLOID
2.0000 | Freq: Once | INTRAVENOUS | Status: AC
  Administered 2010-11-15: 2 via ORAL

## 2010-11-16 ENCOUNTER — Telehealth: Payer: Self-pay

## 2010-11-16 NOTE — Telephone Encounter (Signed)
Message copied by Marlon Pel on Tue Nov 16, 2010  5:18 PM ------      Message from: Gatha Mayer      Created: Tue Nov 16, 2010  2:27 PM       Let her know the stomach empties somewhat slowly in first hour but really catches up in second hour      Not entirely clear to me what this means - may explain how she feels full but not the pain and vomiting at night            I recommend EGD (with plans to biopsy) to evaluate epigastric pain, nausea and vomiting

## 2010-11-16 NOTE — Telephone Encounter (Signed)
Patient advised of results EGD scheduled for 12/21/10

## 2010-11-16 NOTE — Telephone Encounter (Signed)
Message copied by Makaylin Carlo L on Tue Nov 16, 2010  5:18 PM ------      Message from: GESSNER, CARL E      Created: Tue Nov 16, 2010  2:27 PM       Let her know the stomach empties somewhat slowly in first hour but really catches up in second hour      Not entirely clear to me what this means - may explain how she feels full but not the pain and vomiting at night            I recommend EGD (with plans to biopsy) to evaluate epigastric pain, nausea and vomiting 

## 2010-11-16 NOTE — Progress Notes (Signed)
Quick Note:  Let her know the stomach empties somewhat slowly in first hour but really catches up in second hour Not entirely clear to me what this means - may explain how she feels full but not the pain and vomiting at night  I recommend EGD (with plans to biopsy) to evaluate epigastric pain, nausea and vomiting ______

## 2010-12-14 ENCOUNTER — Ambulatory Visit (AMBULATORY_SURGERY_CENTER): Payer: 59

## 2010-12-14 VITALS — Ht 65.0 in | Wt 156.9 lb

## 2010-12-14 DIAGNOSIS — R1013 Epigastric pain: Secondary | ICD-10-CM

## 2010-12-14 DIAGNOSIS — R112 Nausea with vomiting, unspecified: Secondary | ICD-10-CM

## 2010-12-21 ENCOUNTER — Encounter: Payer: Self-pay | Admitting: Internal Medicine

## 2010-12-21 ENCOUNTER — Ambulatory Visit (AMBULATORY_SURGERY_CENTER): Payer: 59 | Admitting: Internal Medicine

## 2010-12-21 VITALS — BP 124/71 | HR 85 | Temp 98.1°F | Resp 14 | Ht 65.0 in | Wt 156.0 lb

## 2010-12-21 DIAGNOSIS — R1013 Epigastric pain: Secondary | ICD-10-CM

## 2010-12-21 DIAGNOSIS — D131 Benign neoplasm of stomach: Secondary | ICD-10-CM

## 2010-12-21 DIAGNOSIS — R112 Nausea with vomiting, unspecified: Secondary | ICD-10-CM

## 2010-12-21 DIAGNOSIS — R933 Abnormal findings on diagnostic imaging of other parts of digestive tract: Secondary | ICD-10-CM

## 2010-12-21 DIAGNOSIS — D1330 Benign neoplasm of unspecified part of small intestine: Secondary | ICD-10-CM

## 2010-12-21 DIAGNOSIS — K2990 Gastroduodenitis, unspecified, without bleeding: Secondary | ICD-10-CM

## 2010-12-21 DIAGNOSIS — K2970 Gastritis, unspecified, without bleeding: Secondary | ICD-10-CM

## 2010-12-21 MED ORDER — SODIUM CHLORIDE 0.9 % IV SOLN: 500.0000 mL | INTRAVENOUS | Status: DC

## 2010-12-21 NOTE — Patient Instructions (Signed)
Please review all discharge papers given to you by your recovery room nurse.  Biopsies were taken and Dr Leone Payor will be back in touch with you concerning the results of these biopsies in about one week.  We will call you in the am to see how you are doing and to answer any questions you may have.  Please call [phone] after you are discharged if you experience any problems. Thank you.

## 2010-12-22 ENCOUNTER — Telehealth: Payer: Self-pay | Admitting: *Deleted

## 2010-12-22 NOTE — Telephone Encounter (Signed)
Follow up Call- Patient questions:  Do you have a fever, pain , or abdominal swelling? no Pain Score  0 *  Have you tolerated food without any problems? yes  Have you been able to return to your normal activities? yes  Do you have any questions about your discharge instructions: Diet   no Medications  no Follow up visit  no  Do you have questions or concerns about your Care? no  Actions: * If pain score is 4 or above: No action needed, pain <4.  Patient c/o "soreness in stomach", she states she thinks from biopsies. Explained to patient that may be and irritation from scope,but to keep I on this and to eat soft foods and warm fluids but if any other symptoms or problems for her to call us back, she understands. Patient denies any nausea or any other symptoms at this time. [name]

## 2010-12-30 ENCOUNTER — Telehealth: Payer: Self-pay | Admitting: *Deleted

## 2010-12-30 NOTE — Telephone Encounter (Signed)
Message copied by Daphine Deutscher on Thu Dec 30, 2010  8:52 AM ------      Message from: Stan Head E      Created: Thu Dec 30, 2010  8:36 AM       Let her know biopsies are ok (office RN)      No recall ([facility]) or letter            I think she could have what is called abdominal migraine      It can respond to nightly amitriptylline - I will call her in next few days but by early next week to discuss

## 2010-12-30 NOTE — Progress Notes (Signed)
Quick Note:  Let her know biopsies are ok (office RN) No recall ([place]) or letter  I think she could have what is called abdominal migraine It can respond to nightly amitriptylline - I will call her in next few days but by early next week to discuss  ______

## 2010-12-30 NOTE — Telephone Encounter (Signed)
Patient notified of results as per Dr. Leone Payor and that he will be calling her to discuss.

## 2011-01-05 ENCOUNTER — Telehealth: Payer: Self-pay | Admitting: Internal Medicine

## 2011-01-05 DIAGNOSIS — R112 Nausea with vomiting, unspecified: Secondary | ICD-10-CM

## 2011-01-05 DIAGNOSIS — R1013 Epigastric pain: Secondary | ICD-10-CM

## 2011-01-05 MED ORDER — AMITRIPTYLINE HCL 25 MG PO TABS: ORAL_TABLET | ORAL | Status: DC

## 2011-01-05 NOTE — Progress Notes (Signed)
Quick Note:  Discussed with patient. Trial of amitriptylline and f/u me 2-3 months. ______

## 2011-01-05 NOTE — Telephone Encounter (Signed)
Patient advised of the above.  She verbalized understanding about both drugs being sedating.

## 2011-01-05 NOTE — Telephone Encounter (Signed)
EGD/bxs did not reveal a cause of her problems. ? Abdominal migraine. Trial of amitriptylline May need less hydroxyzine - will have RN call her and let her know that both of these are sedating.

## 2011-01-05 NOTE — Assessment & Plan Note (Signed)
Will try amitriptylline

## 2011-01-05 NOTE — Assessment & Plan Note (Signed)
Trial of amitriptylline

## 2011-04-20 DIAGNOSIS — N3010 Interstitial cystitis (chronic) without hematuria: Secondary | ICD-10-CM | POA: Insufficient documentation

## 2011-07-12 ENCOUNTER — Encounter: Payer: Self-pay | Admitting: Neurology

## 2011-07-12 ENCOUNTER — Ambulatory Visit (INDEPENDENT_AMBULATORY_CARE_PROVIDER_SITE_OTHER): Payer: 59 | Admitting: Internal Medicine

## 2011-07-12 ENCOUNTER — Encounter: Payer: Self-pay | Admitting: Internal Medicine

## 2011-07-12 VITALS — BP 118/72 | HR 88 | Ht 65.0 in | Wt 165.0 lb

## 2011-07-12 DIAGNOSIS — G43909 Migraine, unspecified, not intractable, without status migrainosus: Secondary | ICD-10-CM

## 2011-07-12 DIAGNOSIS — R112 Nausea with vomiting, unspecified: Secondary | ICD-10-CM

## 2011-07-12 DIAGNOSIS — G43D0 Abdominal migraine, not intractable: Secondary | ICD-10-CM

## 2011-07-12 DIAGNOSIS — G43809 Other migraine, not intractable, without status migrainosus: Secondary | ICD-10-CM

## 2011-07-12 DIAGNOSIS — R1013 Epigastric pain: Secondary | ICD-10-CM

## 2011-07-12 NOTE — Assessment & Plan Note (Signed)
Marked improvement in the last 6 months, on amitriptyline. Weight gain issues as outlined in other problem assessment. Question if her amitriptyline. Have her see neurology for further evaluation and treatment of migraines and hopefully successful therapy with that her side effect profile.

## 2011-07-12 NOTE — Assessment & Plan Note (Signed)
She appears to have this diagnosis. Await neurology evaluation to see what they think as well. Continue current therapy for now.

## 2011-07-12 NOTE — Assessment & Plan Note (Signed)
She has responded well to the amitriptyline. Unfortunately she has gained some weight. That may be from the amitriptyline. I've been asked to see a neurologist, Dr. Modesto Charon, for overall migraine management and she will consider reducing the dose of amitriptyline 12.5 mg nightly in the interim. Hopefully there will be a medication regimen with less side effects a controls all of her migraine problems.

## 2011-07-12 NOTE — Patient Instructions (Addendum)
You can try taking 1/2 tablet of the amitriptyline if you want. It may still work and reduce risk of weight gain. You have been scheduled for a Neurology appointment with Dr. Modesto Charon on 08/19/11 @ 3:30. Please arrive 30 minutes early. Follow-up with Dr. Leone Payor can be as needed after that.

## 2011-07-12 NOTE — Progress Notes (Signed)
Subjective:    Patient ID: [name], female    DOB: August 16, 1975, 36 y.o.   MRN: [id number]  HPI Patient returns for followup, having last been seen in the summer. At that point, it was thought her recurrent abdominal pain problems and nausea and vomiting or part of an abdominal migraine syndrome. Amitriptyline 25 mg nightly was begun after a short period 12.5 mg nightly. Since that time she has not had any spells of abdominal pain. A long period of time symptom free. She occasionally has headaches with some nausea and vomiting but the abdominal pain episodes are gone. She has gained about 10 pounds over time however. She has no other complaints today. Wt Readings from Last 3 Encounters:  07/12/11 165 lb (74.844 kg)  12/21/10 156 lb (70.761 kg)  12/14/10 156 lb 14.4 oz (71.169 kg)   Allergies  Allergen Reactions  . Latex Rash and Other (See Comments)    wheezing  . Pork-Derived Products    Outpatient Prescriptions Prior to Visit  Medication Sig Dispense Refill  . amitriptyline (ELAVIL) 25 MG tablet 1/2 tablet nightly 1 hour before bedtime for 2 weeks then 1 tablet nightly  30 tablet  2  . azelastine (ASTEPRO) 137 MCG/SPRAY nasal spray 1 spray by Nasal route 2 (two) times daily. Use in each nostril as directed       . budesonide-formoterol (SYMBICORT) 160-4.5 MCG/ACT inhaler Inhale 2 puffs into the lungs 2 (two) times daily.        Marland Kitchen HYDROcodone-acetaminophen (VICODIN ES) 7.5-750 MG per tablet Take 1 tablet by mouth every 6 (six) hours as needed.        . hydrOXYzine (ATARAX) 25 MG tablet Take 25 mg by mouth 3 (three) times daily as needed.        Marland Kitchen ketoprofen (ORUDIS) 75 MG capsule Take 75 mg by mouth 3 (three) times daily as needed.        . levalbuterol (XOPENEX) 1.25 MG/3ML nebulizer solution Take 1 ampule by nebulization every 4 (four) hours as needed.        . Loratadine (CLARITIN) 10 MG CAPS Take 1 capsule by mouth daily.        . metoCLOPramide (REGLAN) 10 MG tablet Take 10  mg by mouth as needed.        . mometasone (NASONEX) 50 MCG/ACT nasal spray Place 2 sprays into the nose daily.       . montelukast (SINGULAIR) 10 MG tablet Take 10 mg by mouth at bedtime.       Lorita Officer Triphasic (ORTHO TRI-CYCLEN, 28, PO) Take by mouth daily.        . pantoprazole (PROTONIX) 40 MG tablet Take 40 mg by mouth daily.        . traMADol (ULTRAM) 50 MG tablet Take 50 mg by mouth every 6 (six) hours as needed.         Past Medical History  Diagnosis Date  . GERD (gastroesophageal reflux disease)   . History of migraine headaches   . Rhinosinusitis   . Asthma   . Bronchitis   . Interstitial cystitis   . Hyperlipidemia   . Swollen lymph nodes     right side neck   Past Surgical History  Procedure Date  . Dilation and curettage of uterus 2003  . Cesarean section 4098,1191    x2  . Tonsilectomy, adenoidectomy, bilateral myringotomy and tubes   . Upper gastrointestinal endoscopy 05/11/2005; 12/21/2010  History   Social History  . Marital Status: Married    Spouse Name: N/A    Number of Children: 2  . Years of Education: N/A   Occupational History  . [employer]   Social History Main Topics  . Smoking status: Never Smoker   . Smokeless tobacco: Never Used  . Alcohol Use: No  . Drug Use: No  .       Family History  Problem Relation Age of Onset  . COPD      grandfather  . Allergies      grandfather  . Asthma Maternal Grandfather   . Breast cancer Maternal Grandmother   . Heart disease Maternal Grandmother   . Lymphoma Maternal Grandmother     Non-Hodgkin's  . Colon cancer Neg Hx           Review of Systems As above    Objective:   Physical Exam Well-developed well-nourished young white woman in no acute distress       Assessment & Plan:

## 2011-08-19 ENCOUNTER — Encounter: Payer: Self-pay | Admitting: Neurology

## 2011-08-19 ENCOUNTER — Ambulatory Visit (INDEPENDENT_AMBULATORY_CARE_PROVIDER_SITE_OTHER): Payer: 59 | Admitting: Neurology

## 2011-08-19 DIAGNOSIS — G43809 Other migraine, not intractable, without status migrainosus: Secondary | ICD-10-CM

## 2011-08-19 DIAGNOSIS — G43909 Migraine, unspecified, not intractable, without status migrainosus: Secondary | ICD-10-CM

## 2011-08-19 DIAGNOSIS — G43D0 Abdominal migraine, not intractable: Secondary | ICD-10-CM

## 2011-08-19 MED ORDER — ELETRIPTAN HYDROBROMIDE 40 MG PO TABS: 40.0000 mg | ORAL_TABLET | ORAL | Status: DC | PRN

## 2011-08-19 MED ORDER — KETOPROFEN 75 MG PO CAPS: 75.0000 mg | ORAL_CAPSULE | Freq: Three times a day (TID) | ORAL | Status: DC | PRN

## 2011-08-19 MED ORDER — ALMOTRIPTAN MALATE 12.5 MG PO TABS: 12.5000 mg | ORAL_TABLET | ORAL | Status: AC | PRN

## 2011-08-19 MED ORDER — HYDROCODONE-ACETAMINOPHEN 7.5-750 MG PO TABS: 1.0000 | ORAL_TABLET | Freq: Four times a day (QID) | ORAL | Status: DC | PRN

## 2011-08-19 MED ORDER — TOPIRAMATE 25 MG PO TABS: ORAL_TABLET | ORAL | Status: DC

## 2011-08-19 NOTE — Progress Notes (Signed)
Dear Dr. Carlean Purl,  Thank you for having me see [name] in consultation today at [facility] for her problem with possible abdominal migraines.  As you may recall, she is a 36 y.o. year old female with a history of migraine headaches the last 18 years. These are described as bitemporal, throbbing in quality, with photophobia and phonophobia, without aura with significant nausea and vomiting. These occurred about 2 times a month clustering just before her period. She has never been on a preventative medication for migraines. However she uses ketoprofen followed by hydrocodone as an abortive. If that does not take the headache away and she resorted to using Relpax. Unfortunately the Relpax makes her fatigued.  Last year she started having frequent abdominal pains followed by vomiting happening multiple times per month. After complete GI investigation you felt these likely represent abdominal migraines. You started her on amitriptyline 25 mg at night. This greatly decreased the spells of nausea and vomiting. However resulted in significant weight gain. Since stopping the amitriptyline the spells have come back. No the spells of nausea and vomiting are accompanied by headache. However to have the same quality about them as nausea and vomiting that she gets with her migraines.  For prolonged nausea vomiting she takes Phenergan. This tends to abort the repetitive vomiting.    Past Medical History  Diagnosis Date  . GERD (gastroesophageal reflux disease)   . History of migraine headaches   . Rhinosinusitis   . Asthma   . Bronchitis   . Interstitial cystitis   . Hyperlipidemia   . Swollen lymph nodes     right side neck    Past Surgical History  Procedure Date  . Dilation and curettage of uterus 2003  . Cesarean section [id number]    x2  . Tonsilectomy, adenoidectomy, bilateral myringotomy and tubes   . Upper gastrointestinal endoscopy 05/11/2005; 12/21/2010    2006 Normal; 2012  erosive gastritis    History   Social History  . Marital Status: Married    Spouse Name: N/A    Number of Children: 2  . Years of Education: N/A   Occupational History  . [employer]   Social History Main Topics  . Smoking status: Never Smoker   . Smokeless tobacco: Never Used  . Alcohol Use: No  . Drug Use: No  . Sexually Active: None   Other Topics Concern  . None   Social History Narrative  . None    Family History  Problem Relation Age of Onset  . COPD      grandfather  . Allergies      grandfather  . Asthma Maternal Grandfather   . Breast cancer Maternal Grandmother   . Heart disease Maternal Grandmother   . Lymphoma Maternal Grandmother     Non-Hodgkin's  . Colon cancer Neg Hx     Current Outpatient Prescriptions on File Prior to Visit  Medication Sig Dispense Refill  . amitriptyline (ELAVIL) 25 MG tablet 1/2 tablet nightly 1 hour before bedtime for 2 weeks then 1 tablet nightly  30 tablet  2  . azelastine (ASTEPRO) 137 MCG/SPRAY nasal spray 1 spray by Nasal route 2 (two) times daily. Use in each nostril as directed       . budesonide-formoterol (SYMBICORT) 160-4.5 MCG/ACT inhaler Inhale 2 puffs into the lungs 2 (two) times daily.        . hydrOXYzine (ATARAX) 25 MG tablet Take 25 mg by mouth 3 (three) times daily  as needed.        . levalbuterol (XOPENEX) 1.25 MG/3ML nebulizer solution Take 1 ampule by nebulization every 4 (four) hours as needed.        . Loratadine (CLARITIN) 10 MG CAPS Take 1 capsule by mouth daily.        . metoCLOPramide (REGLAN) 10 MG tablet Take 10 mg by mouth as needed.        . mometasone (NASONEX) 50 MCG/ACT nasal spray Place 2 sprays into the nose daily.       . montelukast (SINGULAIR) 10 MG tablet Take 10 mg by mouth at bedtime.       Lenard Forth Triphasic (ORTHO TRI-CYCLEN, 28, PO) Take by mouth daily.        . pantoprazole (PROTONIX) 40 MG tablet Take 40 mg by mouth daily.        . traMADol (ULTRAM) 50 MG  tablet Take 50 mg by mouth every 6 (six) hours as needed.          Allergies  Allergen Reactions  . Latex Rash and Other (See Comments)    wheezing  . Pork-Derived Products       ROS:  13 systems were reviewed and are unremarkable.   Examination:  Filed Vitals:   08/19/11 1511  BP: 118/82  Pulse: 80  Height: 5' 5"  (1.651 m)     In general, well appearing women.  Cardiovascular: The patient has a regular rate and rhythm and no carotid bruits.  Fundoscopy:  Disks are flat. Vessel caliber within normal limits.  Mental status:   The patient is oriented to person, place and time. Recent and remote memory are intact. Attention span and concentration are normal. Language including repetition, naming, following commands are intact. Fund of knowledge of current and historical events, as well as vocabulary are normal.  Cranial Nerves: Pupils are equally round and reactive to light. Visual fields full to confrontation. Extraocular movements are intact without nystagmus. Facial sensation and muscles of mastication are intact. Muscles of facial expression are symmetric. Hearing intact to bilateral finger rub. Tongue protrusion, uvula, palate midline.  Shoulder shrug intact  Motor:  The patient has normal bulk and tone, no pronator drift.  There are no adventitious movements.  5/5 muscle strength bilaterally.  Reflexes:   Biceps  Triceps Brachioradialis Knee Ankle  Right 2+  2+  2+   2+ 2+  Left  2+  2+  2+   2+ 2+  Toes down  Coordination:  Normal finger to nose.  No dysdiadokinesia.  Sensation is intact to temperature and vibration.  Gait and Station are normal.  Tandem gait is intact.  Romberg is negative   Impression/Recs: 1.  Migraine/Abdominal Migraines - Given their previous response to Elavil it seems reasonable that these represent abdominal migraines.  I have started her on a titration of TPM to 50 qhs to see if we can help relieve these.  In addition I hope to  help her normal migraines.  I have given her Axert as an abortive for her migraine headaches as this tends to have less side effects then Relpax.  I have also refilled her Vicodin and ketoprofen and Relpax prescriptions.  We will see the patient back in 2 months.  Thank you for having [place] see [name] in consultation.  Feel free to contact me with any questions.  Kavin Leech Jacelyn Grip, MD [facility], [place] [address], [place] [address] Phone: [phone] Fax: [phone]  [phone].  

## 2011-10-27 ENCOUNTER — Ambulatory Visit: Payer: 59 | Admitting: Neurology

## 2011-11-10 ENCOUNTER — Ambulatory Visit (INDEPENDENT_AMBULATORY_CARE_PROVIDER_SITE_OTHER): Payer: 59 | Admitting: Neurology

## 2011-11-10 ENCOUNTER — Encounter: Payer: Self-pay | Admitting: Neurology

## 2011-11-10 VITALS — BP 96/60 | HR 70 | Wt 140.0 lb

## 2011-11-10 DIAGNOSIS — G43909 Migraine, unspecified, not intractable, without status migrainosus: Secondary | ICD-10-CM

## 2011-11-10 DIAGNOSIS — G43D0 Abdominal migraine, not intractable: Secondary | ICD-10-CM

## 2011-11-10 DIAGNOSIS — G43809 Other migraine, not intractable, without status migrainosus: Secondary | ICD-10-CM

## 2011-11-10 MED ORDER — TOPIRAMATE 25 MG PO TABS: ORAL_TABLET | ORAL | Status: DC

## 2011-11-11 NOTE — Progress Notes (Signed)
Dear Dr. Kenton Kingfisher,  I saw  [name] back in [place] [facility] for her problem with probable abdominal migraine as well as common migraine.  At her last visit I started her on Topamax 58m qhs.  This has stopped her abdominal migraine but she continues to have menstrual related headaches.  These usually start 2 days before her menstrual cycle and go to about 4 days into her menstrual cycle.  She typically has 3-4 of these headaches per month.  They are partially relieved with ketoprofen and/or vicodin.  Relpax is effective but is too sedating.  I gave her Axert but it was too expensive.  Her menstrual cycle is relatively regular.  She is not on birth control because of her husband had a vasectomy.    Medical history, social history, and family history were reviewed and have not changed since the last clinic visit.  Current Outpatient Prescriptions on File Prior to Visit  Medication Sig Dispense Refill  . azelastine (ASTEPRO) 137 MCG/SPRAY nasal spray 1 spray by Nasal route 2 (two) times daily. Use in each nostril as directed       . budesonide-formoterol (SYMBICORT) 160-4.5 MCG/ACT inhaler Inhale 2 puffs into the lungs 2 (two) times daily.        .Marland Kitcheneletriptan (RELPAX) 40 MG tablet One tablet by mouth as needed for migraine headache.  If the headache improves and then returns, dose may be repeated after 2 hours have elapsed since first dose (do not exceed 80 mg per day).  10 tablet  2  . HYDROcodone-acetaminophen (VICODIN ES) 7.5-750 MG per tablet Take 1 tablet by mouth every 6 (six) hours as needed.  30 tablet  1  . hydrOXYzine (ATARAX) 25 MG tablet Take 25 mg by mouth 3 (three) times daily as needed.        .Marland Kitchenketoprofen (ORUDIS) 75 MG capsule Take 1 capsule (75 mg total) by mouth 3 (three) times daily as needed.  30 capsule  1  . levalbuterol (XOPENEX) 1.25 MG/3ML nebulizer solution Take 1 ampule by nebulization every 4 (four) hours as needed.        . Loratadine (CLARITIN) 10 MG CAPS  Take 1 capsule by mouth daily.        . metoCLOPramide (REGLAN) 10 MG tablet Take 10 mg by mouth as needed.        . mometasone (NASONEX) 50 MCG/ACT nasal spray Place 2 sprays into the nose daily.       . montelukast (SINGULAIR) 10 MG tablet Take 10 mg by mouth at bedtime.       . pantoprazole (PROTONIX) 40 MG tablet Take 40 mg by mouth daily.        .Marland Kitchentopiramate (TOPAMAX) 25 MG tablet start with 3 tabs at night for 1 week, then increase to 4 tabs at night from then.  120 tablet  3  . traMADol (ULTRAM) 50 MG tablet Take 50 mg by mouth every 6 (six) hours as needed.        .Marland Kitchenalmotriptan (AXERT) 12.5 MG tablet Take 1 tablet (12.5 mg total) by mouth as needed for migraine. may repeat in 2 hours if needed  10 tablet  1  . Norgestim-Eth Estrad Triphasic (ORTHO TRI-CYCLEN, 28, PO) Take by mouth daily.          Allergies  Allergen Reactions  . Latex Rash and Other (See Comments)    wheezing  . Pork-Derived Products     ROS:  13 systems were  reviewed and are unremarkable.  Exam: . Filed Vitals:   11/10/11 1628  BP: 96/60  Pulse: 70  Weight: 140 lb (63.504 kg)    Impression/Recommendations:  1.  Abdominal migraine - continue Topamax 2.  Common migraine - we will increase Topamax to 100 qhs.  If this does not prevent her menstrual headaches she is going to try ketoprofen 171m bid for seven days, when her headache starts for her menstrual cycle as a preventative.  If this does not work we will consider a triptan such as Zomig 571mbid during her menstrual cycle.  We will see the patient back in 3 months.  MaKavin LeechoJacelyn GripMD [facility], [place]

## 2012-01-20 ENCOUNTER — Ambulatory Visit (INDEPENDENT_AMBULATORY_CARE_PROVIDER_SITE_OTHER): Payer: 59 | Admitting: Neurology

## 2012-01-20 ENCOUNTER — Encounter: Payer: Self-pay | Admitting: Neurology

## 2012-01-20 VITALS — BP 108/68 | HR 72 | Wt 134.0 lb

## 2012-01-20 DIAGNOSIS — G43809 Other migraine, not intractable, without status migrainosus: Secondary | ICD-10-CM

## 2012-01-20 DIAGNOSIS — G43D0 Abdominal migraine, not intractable: Secondary | ICD-10-CM

## 2012-01-20 DIAGNOSIS — G43909 Migraine, unspecified, not intractable, without status migrainosus: Secondary | ICD-10-CM

## 2012-01-20 MED ORDER — ELETRIPTAN HYDROBROMIDE 40 MG PO TABS: 40.0000 mg | ORAL_TABLET | ORAL | Status: DC | PRN

## 2012-01-20 MED ORDER — TOPIRAMATE 100 MG PO TABS: 100.0000 mg | ORAL_TABLET | Freq: Every day | ORAL | Status: DC

## 2012-01-20 MED ORDER — HYDROCODONE-ACETAMINOPHEN 7.5-750 MG PO TABS: 1.0000 | ORAL_TABLET | Freq: Four times a day (QID) | ORAL | Status: DC | PRN

## 2012-01-20 MED ORDER — KETOPROFEN 75 MG PO CAPS: 75.0000 mg | ORAL_CAPSULE | Freq: Three times a day (TID) | ORAL | Status: DC | PRN

## 2012-01-20 NOTE — Progress Notes (Signed)
Dear Dr. Tiburcio Pea,  I saw  Mauri Brooklyn back in [place] [facility] for her problem with abdominal migraines and menstrual migraines.  Since start her on Topamax for the abdominal migraines they have stopped.  However, she is still have frequent 2-3 menstrual migraines per cycle.  The period of susceptibility starts 2 days before her period and ends usually in 7 days.  At her last visit I increased her Topamax to 100 qhs, and asked her to take ketoprofen 75 bid during those seven days.  It has not really decreased the headaches.  She continues to use Relpax and occasionally Vicodin as a successful abortive.  However, Relpax 40 makes her fatigued.    Medical history, social history, and family history were reviewed and have not changed since the last clinic visit.  Current Outpatient Prescriptions on File Prior to Visit  Medication Sig Dispense Refill  . almotriptan (AXERT) 12.5 MG tablet Take 1 tablet (12.5 mg total) by mouth as needed for migraine. may repeat in 2 hours if needed  10 tablet  1  . azelastine (ASTEPRO) 137 MCG/SPRAY nasal spray 1 spray by Nasal route 2 (two) times daily. Use in each nostril as directed       . budesonide-formoterol (SYMBICORT) 160-4.5 MCG/ACT inhaler Inhale 2 puffs into the lungs 2 (two) times daily.        . hydrOXYzine (ATARAX) 25 MG tablet Take 25 mg by mouth 3 (three) times daily as needed.        . levalbuterol (XOPENEX) 1.25 MG/3ML nebulizer solution Take 1 ampule by nebulization every 4 (four) hours as needed.        . Loratadine (CLARITIN) 10 MG CAPS Take 1 capsule by mouth daily.        . metoCLOPramide (REGLAN) 10 MG tablet Take 10 mg by mouth as needed.        . mometasone (NASONEX) 50 MCG/ACT nasal spray Place 2 sprays into the nose daily.       . montelukast (SINGULAIR) 10 MG tablet Take 10 mg by mouth at bedtime.       . pantoprazole (PROTONIX) 40 MG tablet Take 40 mg by mouth daily.        Marland Kitchen topiramate (TOPAMAX) 25 MG tablet start with 3  tabs at night for 1 week, then increase to 4 tabs at night from then.  120 tablet  3  . traMADol (ULTRAM) 50 MG tablet Take 50 mg by mouth every 6 (six) hours as needed.        Marland Kitchen DISCONTD: eletriptan (RELPAX) 40 MG tablet One tablet by mouth as needed for migraine headache.  If the headache improves and then returns, dose may be repeated after 2 hours have elapsed since first dose (do not exceed 80 mg per day).  10 tablet  2    Allergies  Allergen Reactions  . Latex Rash and Other (See Comments)    wheezing  . Pork-Derived Products     ROS:  13 systems were reviewed and are unremarkable.  Exam: . Filed Vitals:   01/20/12 1420  BP: 108/68  Pulse: 72  Weight: 134 lb (60.782 kg)    In general, well appearing women.    Impression/Recommendations:  1.  Abdominal migraines - continue Topamax 100 daily. 2.  Menstrual migraines - we are going to try using Relpax as a preventative during her susceptible time.  She will take Relpax 20mg  bid for 7 days to see if this prevents her headache.  This is typically been shown to work with other triptans but I don't think it is unreasonable with Relpax.  She can use up to 1 other Relpax during that time to try to relieve a break through headache.  I have given her prescriptions for ketoprofen and vicodin to use as necessary as well.  As I will be joining the faculty at [employer] she can follow up with you.  If she wants to follow up with me in [place] that will be an option as well.  Lupita Raider Modesto Charon, MD [facility], [place]

## 2013-09-28 DIAGNOSIS — R102 Pelvic and perineal pain: Secondary | ICD-10-CM | POA: Insufficient documentation

## 2015-06-19 MED FILL — TOPIRAMATE 100 MG TABLET: 100 | 90 days supply | Qty: 90 | Fill #2

## 2015-06-19 MED FILL — HYDROCODON-APAP 7.5-325: 7.5-325 | 30 days supply | Qty: 120 | Fill #0

## 2015-06-19 MED FILL — LEVOTHYROXINE 50 MCG TABLET: 50 | 30 days supply | Qty: 30 | Fill #6

## 2015-06-19 MED FILL — VENTOLIN HFA 90 MCG INHALER: 108 (90 BAS | 30 days supply | Qty: 18 | Fill #1

## 2015-06-19 MED FILL — SYMBICORT 160-4.5 MCG INH: 160-4.5 | 30 days supply | Qty: 10 | Fill #0

## 2015-06-25 MED FILL — LEVOTHYROXINE 75 MCG TABLET: 75 | 30 days supply | Qty: 30 | Fill #0

## 2015-07-21 DIAGNOSIS — N3010 Interstitial cystitis (chronic) without hematuria: Secondary | ICD-10-CM | POA: Diagnosis not present

## 2015-07-21 DIAGNOSIS — R102 Pelvic and perineal pain: Secondary | ICD-10-CM | POA: Diagnosis not present

## 2015-07-31 MED FILL — MONTELUKAST SOD 10 MG TAB: 10 | 90 days supply | Qty: 90 | Fill #0

## 2015-07-31 MED FILL — HYDROCODON-APAP 7.5-325: 7.5-325 | 30 days supply | Qty: 120 | Fill #0

## 2015-07-31 MED FILL — hydrOXYzine HCL 25 MG TABS: 25 | 30 days supply | Qty: 90 | Fill #0

## 2015-08-05 DIAGNOSIS — J301 Allergic rhinitis due to pollen: Secondary | ICD-10-CM | POA: Diagnosis not present

## 2015-08-05 DIAGNOSIS — J4530 Mild persistent asthma, uncomplicated: Secondary | ICD-10-CM | POA: Diagnosis not present

## 2015-08-05 DIAGNOSIS — J3089 Other allergic rhinitis: Secondary | ICD-10-CM | POA: Diagnosis not present

## 2015-08-05 DIAGNOSIS — J019 Acute sinusitis, unspecified: Secondary | ICD-10-CM | POA: Diagnosis not present

## 2015-08-05 MED FILL — predniSONE 10 MG TABS: 10 | 4 days supply | Qty: 10 | Fill #0

## 2015-08-05 MED FILL — AZITHROMYCIN 250 MG TABLET: 250 | 5 days supply | Qty: 6 | Fill #0

## 2015-08-07 MED FILL — LEVOTHYROXINE 75 MCG TABLET: 75 | 30 days supply | Qty: 30 | Fill #1

## 2015-08-07 MED FILL — diazePAM 10 MG TABS: 10 | 30 days supply | Qty: 30 | Fill #1

## 2015-08-20 DIAGNOSIS — E039 Hypothyroidism, unspecified: Secondary | ICD-10-CM | POA: Diagnosis not present

## 2015-08-20 DIAGNOSIS — E785 Hyperlipidemia, unspecified: Secondary | ICD-10-CM | POA: Diagnosis not present

## 2015-08-21 MED FILL — valACYclovir HCL 1 GM TABS: 1 | 30 days supply | Qty: 30 | Fill #1

## 2015-08-21 MED FILL — SYMBICORT 160-4.5 MCG INH: 160-4.5 | 30 days supply | Qty: 10 | Fill #1

## 2015-08-28 DIAGNOSIS — Z0000 Encounter for general adult medical examination without abnormal findings: Secondary | ICD-10-CM | POA: Diagnosis not present

## 2015-08-28 DIAGNOSIS — J4520 Mild intermittent asthma, uncomplicated: Secondary | ICD-10-CM | POA: Diagnosis not present

## 2015-08-28 DIAGNOSIS — K219 Gastro-esophageal reflux disease without esophagitis: Secondary | ICD-10-CM | POA: Diagnosis not present

## 2015-08-28 DIAGNOSIS — G43909 Migraine, unspecified, not intractable, without status migrainosus: Secondary | ICD-10-CM | POA: Diagnosis not present

## 2015-08-28 DIAGNOSIS — N3010 Interstitial cystitis (chronic) without hematuria: Secondary | ICD-10-CM | POA: Diagnosis not present

## 2015-08-28 DIAGNOSIS — E039 Hypothyroidism, unspecified: Secondary | ICD-10-CM | POA: Diagnosis not present

## 2015-08-28 DIAGNOSIS — E7800 Pure hypercholesterolemia, unspecified: Secondary | ICD-10-CM | POA: Diagnosis not present

## 2015-08-28 MED FILL — VENTOLIN HFA 90 MCG INHALER: 108 (90 BAS | 90 days supply | Qty: 54 | Fill #0

## 2015-08-28 MED FILL — PANTOPRAZOLE SOD DR 40 MG T: 40 | 90 days supply | Qty: 90 | Fill #0

## 2015-08-28 MED FILL — PROMETHAZINE 25 MG TABLET: 25 | 7 days supply | Qty: 30 | Fill #0

## 2015-08-28 MED FILL — LEVOTHYROXINE 100 MCG TAB: 100 | 30 days supply | Qty: 30 | Fill #0

## 2015-09-01 DIAGNOSIS — R399 Unspecified symptoms and signs involving the genitourinary system: Secondary | ICD-10-CM | POA: Diagnosis not present

## 2015-09-08 MED FILL — HYDROCODON-APAP 7.5-325: 7.5-325 | 30 days supply | Qty: 120 | Fill #0

## 2015-09-18 MED FILL — TOPIRAMATE 100 MG TABLET: 100 | 90 days supply | Qty: 90 | Fill #3

## 2015-09-28 MED FILL — LEVOTHYROXINE 100 MCG TAB: 100 | 30 days supply | Qty: 30 | Fill #1

## 2015-10-09 MED FILL — FLUOROMETHOLONE 0.1% DROPS: 0.1 | 10 days supply | Qty: 5 | Fill #0

## 2015-10-09 MED FILL — diazePAM 10 MG TABS: 10 | 30 days supply | Qty: 30 | Fill #0

## 2015-10-09 MED FILL — HYDROCODON-APAP 7.5-325: 7.5-325 | 30 days supply | Qty: 120 | Fill #0

## 2015-10-21 MED FILL — PATADAY 0.2% EYE DROPS: 0.2 | 30 days supply | Qty: 3 | Fill #0

## 2015-10-29 DIAGNOSIS — R102 Pelvic and perineal pain: Secondary | ICD-10-CM | POA: Diagnosis not present

## 2015-10-29 DIAGNOSIS — N3010 Interstitial cystitis (chronic) without hematuria: Secondary | ICD-10-CM | POA: Diagnosis not present

## 2015-10-30 MED FILL — LEVOTHYROXINE 100 MCG TAB: 100 | 30 days supply | Qty: 30 | Fill #2

## 2015-11-04 MED FILL — SYMBICORT 160-4.5 MCG INH: 160-4.5 | 30 days supply | Qty: 10 | Fill #2

## 2015-11-19 MED FILL — MONTELUKAST SOD 10 MG TAB: 10 | 90 days supply | Qty: 90 | Fill #1

## 2015-11-20 MED FILL — hydrOXYzine HCL 25 MG TABS: 25 | 30 days supply | Qty: 90 | Fill #1

## 2015-11-30 MED FILL — MARCAINE 0.5% VIAL: 0.5 | 30 days supply | Qty: 450 | Fill #0

## 2015-11-30 MED FILL — HYDROCODON-APAP 7.5-325: 7.5-325 | 30 days supply | Qty: 120 | Fill #0

## 2015-12-01 MED FILL — LEVOTHYROXINE 100 MCG TAB: 100 | 30 days supply | Qty: 30 | Fill #3

## 2015-12-04 DIAGNOSIS — H1045 Other chronic allergic conjunctivitis: Secondary | ICD-10-CM | POA: Diagnosis not present

## 2015-12-04 DIAGNOSIS — J301 Allergic rhinitis due to pollen: Secondary | ICD-10-CM | POA: Diagnosis not present

## 2015-12-04 DIAGNOSIS — J4530 Mild persistent asthma, uncomplicated: Secondary | ICD-10-CM | POA: Diagnosis not present

## 2015-12-04 DIAGNOSIS — J3089 Other allergic rhinitis: Secondary | ICD-10-CM | POA: Diagnosis not present

## 2015-12-04 MED FILL — AEROCHAMBER: 30 days supply | Qty: 1 | Fill #0

## 2015-12-04 MED FILL — predniSONE 10 MG TABS: 10 | 4 days supply | Qty: 10 | Fill #0

## 2015-12-08 DIAGNOSIS — J3089 Other allergic rhinitis: Secondary | ICD-10-CM | POA: Diagnosis not present

## 2015-12-08 DIAGNOSIS — J301 Allergic rhinitis due to pollen: Secondary | ICD-10-CM | POA: Diagnosis not present

## 2015-12-08 DIAGNOSIS — J3081 Allergic rhinitis due to animal (cat) (dog) hair and dander: Secondary | ICD-10-CM | POA: Diagnosis not present

## 2015-12-09 MED FILL — FLUCONAZOLE 150 MG TABLET: 150 | 7 days supply | Qty: 3 | Fill #0

## 2015-12-10 MED FILL — MAGIC MOUTHWASH BOP FORM: 2 days supply | Qty: 120 | Fill #0

## 2015-12-22 MED FILL — TOPIRAMATE 100 MG TABLET: 100 | 90 days supply | Qty: 90 | Fill #0

## 2015-12-23 DIAGNOSIS — E039 Hypothyroidism, unspecified: Secondary | ICD-10-CM | POA: Diagnosis not present

## 2015-12-23 DIAGNOSIS — J069 Acute upper respiratory infection, unspecified: Secondary | ICD-10-CM | POA: Diagnosis not present

## 2015-12-24 MED FILL — AZITHROMYCIN 250 MG TABLET: 250 | 5 days supply | Qty: 6 | Fill #0

## 2015-12-25 DIAGNOSIS — J4530 Mild persistent asthma, uncomplicated: Secondary | ICD-10-CM | POA: Diagnosis not present

## 2015-12-25 DIAGNOSIS — J3089 Other allergic rhinitis: Secondary | ICD-10-CM | POA: Diagnosis not present

## 2015-12-25 DIAGNOSIS — J209 Acute bronchitis, unspecified: Secondary | ICD-10-CM | POA: Diagnosis not present

## 2015-12-25 DIAGNOSIS — J301 Allergic rhinitis due to pollen: Secondary | ICD-10-CM | POA: Diagnosis not present

## 2015-12-25 MED FILL — BENZONATATE 100 MG CAPSULE: 100 | 10 days supply | Qty: 30 | Fill #0

## 2015-12-25 MED FILL — predniSONE 10 MG TABS: 10 | 6 days supply | Qty: 21 | Fill #0

## 2015-12-29 MED FILL — LEVOTHYROXINE 100 MCG TAB: 100 | 30 days supply | Qty: 30 | Fill #4

## 2015-12-29 MED FILL — valACYclovir HCL 1 GM TABS: 1 | 30 days supply | Qty: 30 | Fill #2

## 2015-12-29 MED FILL — FLUCONAZOLE 150 MG TABLET: 150 | 7 days supply | Qty: 3 | Fill #1

## 2016-01-13 MED FILL — HYDROCODON-APAP 7.5-325: 7.5-325 | 30 days supply | Qty: 120 | Fill #0

## 2016-01-28 DIAGNOSIS — R102 Pelvic and perineal pain: Secondary | ICD-10-CM | POA: Diagnosis not present

## 2016-01-28 DIAGNOSIS — N3010 Interstitial cystitis (chronic) without hematuria: Secondary | ICD-10-CM | POA: Diagnosis not present

## 2016-02-03 MED FILL — LEVOTHYROXINE 100 MCG TAB: 100 | 30 days supply | Qty: 30 | Fill #5

## 2016-02-11 MED FILL — SULFAMETHOXAZOLE/TMP DS TAB: 800-160 | 3 days supply | Qty: 6 | Fill #0

## 2016-02-17 DIAGNOSIS — R300 Dysuria: Secondary | ICD-10-CM | POA: Diagnosis not present

## 2016-02-17 MED FILL — HYDROCODON-APAP 7.5-325: 7.5-325 | 30 days supply | Qty: 120 | Fill #0

## 2016-02-17 MED FILL — NITROFURANTOIN MONO-MCR 100: 100 | 5 days supply | Qty: 10 | Fill #0

## 2016-02-23 MED FILL — hydrOXYzine HCL 25 MG TABS: 25 | 30 days supply | Qty: 90 | Fill #2

## 2016-02-23 MED FILL — MONTELUKAST SOD 10 MG TAB: 10 | 90 days supply | Qty: 90 | Fill #2

## 2016-03-08 MED FILL — LEVOTHYROXINE 100 MCG TAB: 100 | 30 days supply | Qty: 30 | Fill #6

## 2016-03-18 MED FILL — HYDROCODON-APAP 7.5-325: 7.5-325 | 30 days supply | Qty: 120 | Fill #0

## 2016-03-25 DIAGNOSIS — E039 Hypothyroidism, unspecified: Secondary | ICD-10-CM | POA: Diagnosis not present

## 2016-03-25 MED FILL — TOPIRAMATE 100 MG TABLET: 100 | 90 days supply | Qty: 90 | Fill #1

## 2016-04-07 DIAGNOSIS — Z01419 Encounter for gynecological examination (general) (routine) without abnormal findings: Secondary | ICD-10-CM | POA: Diagnosis not present

## 2016-04-07 DIAGNOSIS — N898 Other specified noninflammatory disorders of vagina: Secondary | ICD-10-CM | POA: Diagnosis not present

## 2016-04-07 DIAGNOSIS — Z6822 Body mass index (BMI) 22.0-22.9, adult: Secondary | ICD-10-CM | POA: Diagnosis not present

## 2016-04-07 MED FILL — valACYclovir HCL 1 GM TABS: 1 | 30 days supply | Qty: 15 | Fill #0

## 2016-04-08 MED FILL — diazePAM 10 MG TABS: 10 | 30 days supply | Qty: 30 | Fill #0

## 2016-04-08 MED FILL — NYSTATIN-TRIAMCINOLONE OINT: 100000-0.1 | 20 days supply | Qty: 30 | Fill #0

## 2016-04-13 DIAGNOSIS — R399 Unspecified symptoms and signs involving the genitourinary system: Secondary | ICD-10-CM | POA: Diagnosis not present

## 2016-04-14 MED FILL — LEVOTHYROXINE 100 MCG TAB: 100 | 30 days supply | Qty: 30 | Fill #0

## 2016-04-18 MED FILL — CIPROFLOXACIN HCL 500 MG TA: 500 | 7 days supply | Qty: 14 | Fill #0

## 2016-04-20 DIAGNOSIS — N3010 Interstitial cystitis (chronic) without hematuria: Secondary | ICD-10-CM | POA: Diagnosis not present

## 2016-04-22 DIAGNOSIS — L650 Telogen effluvium: Secondary | ICD-10-CM | POA: Diagnosis not present

## 2016-04-22 DIAGNOSIS — F419 Anxiety disorder, unspecified: Secondary | ICD-10-CM | POA: Diagnosis not present

## 2016-04-22 MED FILL — HYDROCODON-APAP 7.5-325: 7.5-325 | 30 days supply | Qty: 120 | Fill #0

## 2016-04-22 MED FILL — ESCITALOPRAM 5 MG TABLET: 5 | 30 days supply | Qty: 30 | Fill #0

## 2016-04-28 DIAGNOSIS — N3010 Interstitial cystitis (chronic) without hematuria: Secondary | ICD-10-CM | POA: Diagnosis not present

## 2016-04-28 DIAGNOSIS — R102 Pelvic and perineal pain: Secondary | ICD-10-CM | POA: Diagnosis not present

## 2016-04-28 MED FILL — MONTELUKAST SOD 10 MG TAB: 10 | 90 days supply | Qty: 90 | Fill #0

## 2016-05-08 DIAGNOSIS — R05 Cough: Secondary | ICD-10-CM | POA: Diagnosis not present

## 2016-05-08 DIAGNOSIS — J069 Acute upper respiratory infection, unspecified: Secondary | ICD-10-CM | POA: Diagnosis not present

## 2016-05-11 DIAGNOSIS — J4521 Mild intermittent asthma with (acute) exacerbation: Secondary | ICD-10-CM | POA: Diagnosis not present

## 2016-05-11 MED FILL — HYDROCODONE-CHLORPHENIRAM S: 10-8 | 10 days supply | Qty: 100 | Fill #0

## 2016-05-11 MED FILL — BENZONATATE 100 MG CAPSULE: 100 | 5 days supply | Qty: 30 | Fill #0

## 2016-05-12 DIAGNOSIS — J4530 Mild persistent asthma, uncomplicated: Secondary | ICD-10-CM | POA: Diagnosis not present

## 2016-05-12 DIAGNOSIS — H1045 Other chronic allergic conjunctivitis: Secondary | ICD-10-CM | POA: Diagnosis not present

## 2016-05-12 DIAGNOSIS — J3089 Other allergic rhinitis: Secondary | ICD-10-CM | POA: Diagnosis not present

## 2016-05-12 DIAGNOSIS — J301 Allergic rhinitis due to pollen: Secondary | ICD-10-CM | POA: Diagnosis not present

## 2016-05-12 DIAGNOSIS — J019 Acute sinusitis, unspecified: Secondary | ICD-10-CM | POA: Diagnosis not present

## 2016-05-12 MED FILL — predniSONE 10 MG TABS: 10 | 6 days supply | Qty: 21 | Fill #0

## 2016-05-12 MED FILL — AZITHROMYCIN 250 MG TABLET: 250 | 5 days supply | Qty: 6 | Fill #0

## 2016-05-20 MED FILL — hydrOXYzine HCL 25 MG TABS: 25 | 30 days supply | Qty: 90 | Fill #0

## 2016-05-20 MED FILL — ESCITALOPRAM 5 MG TABLET: 5 | 30 days supply | Qty: 30 | Fill #1

## 2016-05-20 MED FILL — LEVOTHYROXINE 100 MCG TAB: 100 | 30 days supply | Qty: 30 | Fill #1

## 2016-05-31 MED FILL — HYDROCODON-APAP 7.5-325: 7.5-325 | 30 days supply | Qty: 120 | Fill #0

## 2016-06-21 MED FILL — ESCITALOPRAM 5 MG TABLET: 5 | 30 days supply | Qty: 30 | Fill #2

## 2016-06-21 MED FILL — LEVOTHYROXINE 100 MCG TABLE: 100 | 30 days supply | Qty: 30 | Fill #2

## 2016-06-28 MED FILL — TOPIRAMATE 100 MG TABLET: 100 | 90 days supply | Qty: 90 | Fill #0

## 2016-07-04 MED FILL — diazePAM 10 MG TABS: 10 | 30 days supply | Qty: 30 | Fill #0

## 2016-07-04 MED FILL — BUPIVACAINE 0.5% VIAL: 0.5 | 30 days supply | Qty: 450 | Fill #0

## 2016-07-04 MED FILL — HYDROCODON-APAP 7.5-325: 7.5-325 | 30 days supply | Qty: 120 | Fill #0

## 2016-07-25 MED FILL — LEVOTHYROXINE 100 MCG TABLE: 100 | 30 days supply | Qty: 30 | Fill #3

## 2016-07-25 MED FILL — ESCITALOPRAM 5 MG TABLET: 5 | 30 days supply | Qty: 30 | Fill #3

## 2016-08-04 DIAGNOSIS — Z79891 Long term (current) use of opiate analgesic: Secondary | ICD-10-CM | POA: Diagnosis not present

## 2016-08-04 DIAGNOSIS — N3010 Interstitial cystitis (chronic) without hematuria: Secondary | ICD-10-CM | POA: Diagnosis not present

## 2016-08-04 DIAGNOSIS — R102 Pelvic and perineal pain: Secondary | ICD-10-CM | POA: Diagnosis not present

## 2016-08-08 MED FILL — SYMBICORT 160-4.5 MCG INH: 160-4.5 | 30 days supply | Qty: 10 | Fill #0

## 2016-08-08 MED FILL — hydrOXYzine HCL 25 MG TABS: 25 | 30 days supply | Qty: 90 | Fill #1

## 2016-08-08 MED FILL — HYDROCODON-APAP 7.5-325: 7.5-325 | 30 days supply | Qty: 120 | Fill #0

## 2016-08-11 MED FILL — URO-MP CAPSULE: 118 | 22 days supply | Qty: 90 | Fill #0

## 2016-08-19 MED FILL — FLUCONAZOLE 150 MG TABLET: 150 | 7 days supply | Qty: 3 | Fill #0

## 2016-08-24 DIAGNOSIS — E162 Hypoglycemia, unspecified: Secondary | ICD-10-CM | POA: Diagnosis not present

## 2016-08-24 DIAGNOSIS — E876 Hypokalemia: Secondary | ICD-10-CM | POA: Diagnosis not present

## 2016-08-24 MED FILL — KLOR-CON M20 TABLET: 20 | 30 days supply | Qty: 30 | Fill #0

## 2016-08-26 MED FILL — FREESTYLE LITE METER: 30 days supply | Qty: 1 | Fill #0

## 2016-08-26 MED FILL — FREESTYLE LITE TEST STRIP: 90 days supply | Qty: 100 | Fill #0

## 2016-08-29 MED FILL — MONTELUKAST SOD 10 MG TAB: 10 | 90 days supply | Qty: 90 | Fill #1

## 2016-08-29 MED FILL — LEVOTHYROXINE 100 MCG TABLE: 100 | 30 days supply | Qty: 30 | Fill #4

## 2016-09-07 MED FILL — HYDROCODON-APAP 7.5-325: 7.5-325 | 30 days supply | Qty: 120 | Fill #0

## 2016-09-16 MED FILL — diazePAM 10 MG TABS: 10 | 30 days supply | Qty: 30 | Fill #1

## 2016-09-27 DIAGNOSIS — N390 Urinary tract infection, site not specified: Secondary | ICD-10-CM | POA: Diagnosis not present

## 2016-09-27 DIAGNOSIS — N3010 Interstitial cystitis (chronic) without hematuria: Secondary | ICD-10-CM | POA: Diagnosis not present

## 2016-10-03 MED FILL — LEVOTHYROXINE 100 MCG TABLE: 100 | 30 days supply | Qty: 30 | Fill #5

## 2016-10-11 MED FILL — HYDROCODON-APAP 7.5-325: 7.5-325 | 30 days supply | Qty: 120 | Fill #0

## 2016-10-20 MED FILL — hydrOXYzine HCL 25 MG TABS: 25 | 30 days supply | Qty: 90 | Fill #2

## 2016-10-21 DIAGNOSIS — M79671 Pain in right foot: Secondary | ICD-10-CM | POA: Diagnosis not present

## 2016-10-21 DIAGNOSIS — M722 Plantar fascial fibromatosis: Secondary | ICD-10-CM | POA: Diagnosis not present

## 2016-10-27 MED FILL — TOPIRAMATE 100 MG TABLET: 100 | 90 days supply | Qty: 90 | Fill #0

## 2016-11-03 MED FILL — LEVOTHYROXINE 100 MCG TABLE: 100 | 30 days supply | Qty: 30 | Fill #0

## 2016-11-08 DIAGNOSIS — N3010 Interstitial cystitis (chronic) without hematuria: Secondary | ICD-10-CM | POA: Diagnosis not present

## 2016-11-08 DIAGNOSIS — R102 Pelvic and perineal pain: Secondary | ICD-10-CM | POA: Diagnosis not present

## 2016-11-10 MED FILL — diazePAM 10 MG TABS: 10 | 30 days supply | Qty: 30 | Fill #0

## 2016-11-10 MED FILL — HYDROCODON-APAP 7.5-325: 7.5-325 | 30 days supply | Qty: 120 | Fill #0

## 2016-12-01 MED FILL — SYMBICORT 160-4.5 MCG INH: 160-4.5 | 30 days supply | Qty: 10 | Fill #1

## 2016-12-01 MED FILL — MONTELUKAST SOD 10 MG TAB: 10 | 90 days supply | Qty: 90 | Fill #2

## 2016-12-07 MED FILL — LEVOTHYROXINE 100 MCG TABLE: 100 | 30 days supply | Qty: 30 | Fill #1

## 2016-12-08 DIAGNOSIS — E7800 Pure hypercholesterolemia, unspecified: Secondary | ICD-10-CM | POA: Diagnosis not present

## 2016-12-08 DIAGNOSIS — E039 Hypothyroidism, unspecified: Secondary | ICD-10-CM | POA: Diagnosis not present

## 2016-12-13 DIAGNOSIS — Z0000 Encounter for general adult medical examination without abnormal findings: Secondary | ICD-10-CM | POA: Diagnosis not present

## 2016-12-13 DIAGNOSIS — G43909 Migraine, unspecified, not intractable, without status migrainosus: Secondary | ICD-10-CM | POA: Diagnosis not present

## 2016-12-13 DIAGNOSIS — K219 Gastro-esophageal reflux disease without esophagitis: Secondary | ICD-10-CM | POA: Diagnosis not present

## 2016-12-13 DIAGNOSIS — J4521 Mild intermittent asthma with (acute) exacerbation: Secondary | ICD-10-CM | POA: Diagnosis not present

## 2016-12-13 DIAGNOSIS — F419 Anxiety disorder, unspecified: Secondary | ICD-10-CM | POA: Diagnosis not present

## 2016-12-13 DIAGNOSIS — E039 Hypothyroidism, unspecified: Secondary | ICD-10-CM | POA: Diagnosis not present

## 2016-12-13 DIAGNOSIS — E7800 Pure hypercholesterolemia, unspecified: Secondary | ICD-10-CM | POA: Diagnosis not present

## 2016-12-13 DIAGNOSIS — N3010 Interstitial cystitis (chronic) without hematuria: Secondary | ICD-10-CM | POA: Diagnosis not present

## 2016-12-13 MED FILL — VENTOLIN HFA 90 MCG INHALER: 108 (90 BAS | 50 days supply | Qty: 54 | Fill #0

## 2016-12-13 MED FILL — PROMETHAZINE 25 MG TABLET: 25 | 8 days supply | Qty: 30 | Fill #0

## 2016-12-13 MED FILL — LEVOTHYROXINE 112 MCG TAB: 112 | 30 days supply | Qty: 30 | Fill #0

## 2016-12-16 MED FILL — HYDROCODON-APAP 7.5-325: 7.5-325 | 30 days supply | Qty: 120 | Fill #0

## 2016-12-21 DIAGNOSIS — H5213 Myopia, bilateral: Secondary | ICD-10-CM | POA: Diagnosis not present

## 2016-12-28 MED FILL — FLUCONAZOLE 150 MG TABLET: 150 | 7 days supply | Qty: 3 | Fill #0

## 2016-12-28 MED FILL — SULFAMETHOXAZOLE/TMP DS TAB: 800-160 | 3 days supply | Qty: 6 | Fill #0

## 2016-12-30 MED FILL — diazePAM 10 MG TABS: 10 | 30 days supply | Qty: 30 | Fill #1

## 2016-12-30 MED FILL — hydrOXYzine HCL 25 MG TABS: 25 | 30 days supply | Qty: 90 | Fill #3

## 2017-01-19 MED FILL — LEVOTHYROXINE 112 MCG TAB: 112 | 30 days supply | Qty: 30 | Fill #1

## 2017-01-19 MED FILL — HYDROCODON-APAP 7.5-325: 7.5-325 | 30 days supply | Qty: 90 | Fill #0

## 2017-02-03 MED FILL — TOPIRAMATE 100 MG TABLET: 100 | 90 days supply | Qty: 90 | Fill #1

## 2017-02-09 DIAGNOSIS — N3010 Interstitial cystitis (chronic) without hematuria: Secondary | ICD-10-CM | POA: Diagnosis not present

## 2017-02-09 DIAGNOSIS — M722 Plantar fascial fibromatosis: Secondary | ICD-10-CM | POA: Diagnosis not present

## 2017-02-10 DIAGNOSIS — Z1231 Encounter for screening mammogram for malignant neoplasm of breast: Secondary | ICD-10-CM | POA: Diagnosis not present

## 2017-02-16 MED FILL — SYMBICORT 160-4.5 MCG INH: 160-4.5 | 30 days supply | Qty: 10 | Fill #2

## 2017-02-16 MED FILL — diazePAM 10 MG TABS: 10 | 30 days supply | Qty: 30 | Fill #2

## 2017-02-20 MED FILL — HYDROCODON-APAP 7.5-325: 7.5-325 | 30 days supply | Qty: 90 | Fill #0

## 2017-03-14 DIAGNOSIS — R399 Unspecified symptoms and signs involving the genitourinary system: Secondary | ICD-10-CM | POA: Diagnosis not present

## 2017-03-20 MED FILL — MONTELUKAST SOD 10 MG TAB: 10 | 90 days supply | Qty: 90 | Fill #3

## 2017-03-20 MED FILL — LEVOTHYROXINE 112 MCG TAB: 112 | 30 days supply | Qty: 30 | Fill #2

## 2017-03-20 MED FILL — valACYclovir HCL 1 GM TABS: 1 | 30 days supply | Qty: 15 | Fill #1

## 2017-03-20 MED FILL — diazePAM 10 MG TABS: 10 | 30 days supply | Qty: 30 | Fill #3

## 2017-03-20 MED FILL — HYDROCODON-APAP 7.5-325: 7.5-325 | 30 days supply | Qty: 90 | Fill #0

## 2017-03-20 MED FILL — hydrOXYzine HCL 25 MG TABS: 25 | 30 days supply | Qty: 90 | Fill #4

## 2017-03-21 MED FILL — SULFAMETHOXAZOLE/TMP DS TAB: 800-160 | 7 days supply | Qty: 14 | Fill #0

## 2017-03-27 DIAGNOSIS — Z3169 Encounter for other general counseling and advice on procreation: Secondary | ICD-10-CM | POA: Diagnosis not present

## 2017-03-28 DIAGNOSIS — J4530 Mild persistent asthma, uncomplicated: Secondary | ICD-10-CM | POA: Diagnosis not present

## 2017-03-28 DIAGNOSIS — J301 Allergic rhinitis due to pollen: Secondary | ICD-10-CM | POA: Diagnosis not present

## 2017-03-28 DIAGNOSIS — J3089 Other allergic rhinitis: Secondary | ICD-10-CM | POA: Diagnosis not present

## 2017-03-28 DIAGNOSIS — H1045 Other chronic allergic conjunctivitis: Secondary | ICD-10-CM | POA: Diagnosis not present

## 2017-03-28 MED FILL — AZELASTINE HCL 0.05% DROPS: 0.05 | 30 days supply | Qty: 6 | Fill #0

## 2017-03-30 MED FILL — PROMETHAZINE 25 MG TABLET: 25 | 8 days supply | Qty: 30 | Fill #1

## 2017-04-25 MED FILL — LEVOTHYROXINE 112 MCG TAB: 112 | 30 days supply | Qty: 30 | Fill #3

## 2017-04-25 MED FILL — HYDROCODON-APAP 7.5-325: 7.5-325 | 30 days supply | Qty: 90 | Fill #0

## 2017-04-27 DIAGNOSIS — J301 Allergic rhinitis due to pollen: Secondary | ICD-10-CM | POA: Diagnosis not present

## 2017-04-27 DIAGNOSIS — J3089 Other allergic rhinitis: Secondary | ICD-10-CM | POA: Diagnosis not present

## 2017-04-27 DIAGNOSIS — J209 Acute bronchitis, unspecified: Secondary | ICD-10-CM | POA: Diagnosis not present

## 2017-04-27 DIAGNOSIS — J4530 Mild persistent asthma, uncomplicated: Secondary | ICD-10-CM | POA: Diagnosis not present

## 2017-04-27 MED FILL — AZITHROMYCIN 250 MG TABLET: 250 | 5 days supply | Qty: 6 | Fill #0

## 2017-04-27 MED FILL — predniSONE 10 MG TABS: 10 | 4 days supply | Qty: 10 | Fill #0

## 2017-04-27 MED FILL — FLUCONAZOLE 150 MG TABLET: 150 | 7 days supply | Qty: 3 | Fill #0

## 2017-05-01 DIAGNOSIS — N839 Noninflammatory disorder of ovary, fallopian tube and broad ligament, unspecified: Secondary | ICD-10-CM | POA: Diagnosis not present

## 2017-05-16 DIAGNOSIS — N3010 Interstitial cystitis (chronic) without hematuria: Secondary | ICD-10-CM | POA: Diagnosis not present

## 2017-05-16 MED FILL — hydrOXYzine HCL 25 MG TABS: 25 | 30 days supply | Qty: 90 | Fill #0

## 2017-05-16 MED FILL — ME-NAPHOS-MB-HYO 1 TABLET: 81.6 | 25 days supply | Qty: 100 | Fill #0

## 2017-05-16 MED FILL — NITROFURANTOIN MCR 50 MG CA: 50 | 30 days supply | Qty: 30 | Fill #0

## 2017-05-19 MED FILL — TOPIRAMATE 100 MG TABLET: 100 | 90 days supply | Qty: 90 | Fill #0

## 2017-05-25 MED FILL — HYDROCODON-APAP 7.5-325: 7.5-325 | 30 days supply | Qty: 90 | Fill #0

## 2017-05-29 DIAGNOSIS — Z6826 Body mass index (BMI) 26.0-26.9, adult: Secondary | ICD-10-CM | POA: Diagnosis not present

## 2017-05-29 DIAGNOSIS — Z01419 Encounter for gynecological examination (general) (routine) without abnormal findings: Secondary | ICD-10-CM | POA: Diagnosis not present

## 2017-06-05 MED FILL — LEVOTHYROXINE 112 MCG TAB: 112 | 30 days supply | Qty: 30 | Fill #4

## 2017-06-26 MED FILL — HYDROCODON-APAP 7.5-325: 7.5-325 | 30 days supply | Qty: 90 | Fill #0

## 2017-06-26 MED FILL — diazePAM 10 MG TABS: 10 | 30 days supply | Qty: 30 | Fill #0

## 2017-06-28 ENCOUNTER — Emergency Department (HOSPITAL_BASED_OUTPATIENT_CLINIC_OR_DEPARTMENT_OTHER): Payer: 59

## 2017-06-28 ENCOUNTER — Other Ambulatory Visit: Payer: Self-pay

## 2017-06-28 ENCOUNTER — Emergency Department (HOSPITAL_BASED_OUTPATIENT_CLINIC_OR_DEPARTMENT_OTHER)
Admission: EM | Admit: 2017-06-28 | Discharge: 2017-06-29 | Disposition: A | Discharge status: Home / Self Care | Payer: 59 | Attending: Emergency Medicine | Admitting: Emergency Medicine

## 2017-06-28 ENCOUNTER — Encounter (HOSPITAL_BASED_OUTPATIENT_CLINIC_OR_DEPARTMENT_OTHER): Payer: Self-pay

## 2017-06-28 DIAGNOSIS — N200 Calculus of kidney: Secondary | ICD-10-CM | POA: Insufficient documentation

## 2017-06-28 DIAGNOSIS — N83201 Unspecified ovarian cyst, right side: Secondary | ICD-10-CM | POA: Insufficient documentation

## 2017-06-28 DIAGNOSIS — R109 Unspecified abdominal pain: Secondary | ICD-10-CM | POA: Diagnosis not present

## 2017-06-28 DIAGNOSIS — R1011 Right upper quadrant pain: Secondary | ICD-10-CM | POA: Diagnosis present

## 2017-06-28 DIAGNOSIS — Z79899 Other long term (current) drug therapy: Secondary | ICD-10-CM | POA: Diagnosis not present

## 2017-06-28 DIAGNOSIS — J45909 Unspecified asthma, uncomplicated: Secondary | ICD-10-CM | POA: Diagnosis not present

## 2017-06-28 HISTORY — DX: Interstitial cystitis (chronic) without hematuria: N30.10

## 2017-06-28 HISTORY — DX: Ulcerative colitis, unspecified, without complications: K51.90

## 2017-06-28 LAB — COMPREHENSIVE METABOLIC PANEL
ALT: 16 U/L (ref 14–54)
AST: 20 U/L (ref 15–41)
Albumin: 4.2 g/dL (ref 3.5–5.0)
Alkaline Phosphatase: 58 U/L (ref 38–126)
Anion gap: 7 (ref 5–15)
BUN: 14 mg/dL (ref 6–20)
CO2: 25 mmol/L (ref 22–32)
Calcium: 9.4 mg/dL (ref 8.9–10.3)
Chloride: 109 mmol/L (ref 101–111)
Creatinine, Ser: 1 mg/dL (ref 0.44–1.00)
GFR calc Af Amer: >60 mL/min (ref >60)
GFR calc non Af Amer: >60 mL/min (ref >60)
Glucose, Bld: 92 mg/dL (ref 65–99)
Potassium: 3.5 mmol/L (ref 3.5–5.1)
Sodium: 141 mmol/L (ref 135–145)
Total Bilirubin: 0.4 mg/dL (ref 0.3–1.2)
Total Protein: 7.3 g/dL (ref 6.5–8.1)

## 2017-06-28 LAB — URINALYSIS, ROUTINE W REFLEX MICROSCOPIC
Bilirubin Urine: NEGATIVE
Glucose, UA: NEGATIVE mg/dL
Hgb urine dipstick: NEGATIVE
Ketones, ur: NEGATIVE mg/dL
Leukocytes, UA: NEGATIVE
Nitrite: NEGATIVE
Protein, ur: NEGATIVE mg/dL
Specific Gravity, Urine: 1.025 (ref 1.005–1.030)
pH: 6 (ref 5.0–8.0)

## 2017-06-28 LAB — CBC WITH DIFFERENTIAL/PLATELET
Basophils Absolute: 0 10*3/uL (ref 0.0–0.1)
Basophils Relative: 0 %
Eosinophils Absolute: 0.6 10*3/uL (ref 0.0–0.7)
Eosinophils Relative: 5 %
HCT: 40.9 % (ref 36.0–46.0)
Hemoglobin: 13.9 g/dL (ref 12.0–15.0)
Lymphocytes Relative: 24 %
Lymphs Abs: 2.9 10*3/uL (ref 0.7–4.0)
MCH: 29.9 pg (ref 26.0–34.0)
MCHC: 34 g/dL (ref 30.0–36.0)
MCV: 88 fL (ref 78.0–100.0)
Monocytes Absolute: 0.8 10*3/uL (ref 0.1–1.0)
Monocytes Relative: 7 %
Neutro Abs: 8.1 10*3/uL — ABNORMAL HIGH (ref 1.7–7.7)
Neutrophils Relative %: 64 %
Platelets: 292 10*3/uL (ref 150–400)
RBC: 4.65 MIL/uL (ref 3.87–5.11)
RDW: 14.5 % (ref 11.5–15.5)
WBC: 12.4 10*3/uL — ABNORMAL HIGH (ref 4.0–10.5)

## 2017-06-28 LAB — PREGNANCY, URINE: Preg Test, Ur: NEGATIVE

## 2017-06-28 MED ORDER — ONDANSETRON HCL 4 MG/2ML IJ SOLN
4.0000 mg | Freq: Once | INTRAMUSCULAR | Status: AC
  Administered 2017-06-28: 4 mg via INTRAVENOUS
  Filled 2017-06-28: qty 2

## 2017-06-28 MED ORDER — ONDANSETRON 4 MG PO TBDP
4.0000 mg | ORAL_TABLET | Freq: Once | ORAL | Status: AC
  Administered 2017-06-28: 4 mg via ORAL
  Filled 2017-06-28: qty 1

## 2017-06-28 MED ORDER — SODIUM CHLORIDE 0.9 % IV BOLUS (SEPSIS)
1000.0000 mL | Freq: Once | INTRAVENOUS | Status: AC
  Administered 2017-06-28: 1000 mL via INTRAVENOUS

## 2017-06-28 MED ORDER — MORPHINE SULFATE (PF) 4 MG/ML IV SOLN
4.0000 mg | Freq: Once | INTRAVENOUS | Status: AC
  Administered 2017-06-28: 4 mg via INTRAVENOUS
  Filled 2017-06-28: qty 1

## 2017-06-28 NOTE — ED Notes (Signed)
C/o rt flank pain radiating to urethra onset yesterday w nausea

## 2017-06-28 NOTE — ED Triage Notes (Signed)
Right side flank pain that started yesterday that radiates to right side and pain shooting through her urethra, c/o nausea as well

## 2017-06-28 NOTE — ED Provider Notes (Addendum)
TIME SEEN: 11:15 PM  CHIEF COMPLAINT: Right flank pain  HPI: Patient is a 42 year old female with history of migraines, ulcerative colitis, interstitial cystitis who is followed by urology at [facility] who presents to the emergency department with complaints of right flank pain.  Describes it as a throbbing, aching pain that radiates down her back.  No abdominal pain.  States this feels different than her IC flares.  She has had nausea but no vomiting or diarrhea.  No fever or chills.  She has had 2 previous C-sections.  She is on hydrocodone at home but states this did not help her symptoms.  No history of kidney stones.  She has had pyelonephritis before.  Denies vaginal bleeding or discharge.  Last menstrual period 06/10/17.  ROS: See HPI Constitutional: no fever  Eyes: no drainage  ENT: no runny nose   Cardiovascular:  no chest pain  Resp: no SOB  GI: no vomiting GU: no dysuria Integumentary: no rash  Allergy: no hives  Musculoskeletal: no leg swelling  Neurological: no slurred speech ROS otherwise negative  PAST MEDICAL HISTORY/PAST SURGICAL HISTORY:  Past Medical History:  Diagnosis Date  . Asthma   . Bronchitis   . Chronic interstitial cystitis   . GERD (gastroesophageal reflux disease)   . History of migraine headaches   . Hyperlipidemia   . Interstitial cystitis   . Rhinosinusitis   . Swollen lymph nodes    right side neck  . Ulcerative colitis ([place])     MEDICATIONS:  Prior to Admission medications   Medication Sig Start Date End Date Taking? Authorizing Provider  azelastine (ASTEPRO) 137 MCG/SPRAY nasal spray 1 spray by Nasal route 2 (two) times daily. Use in each nostril as directed     [provider]  budesonide-formoterol (SYMBICORT) 160-4.5 MCG/ACT inhaler Inhale 2 puffs into the lungs 2 (two) times daily.      [provider]  eletriptan (RELPAX) 40 MG tablet One tablet by mouth at onset of headache. May repeat in 2 hours if headache persists  or recurs. 01/20/12 01/19/13  Clearnce Sorrel, MD  HYDROcodone-acetaminophen (VICODIN ES) 7.5-750 MG per tablet Take 1 tablet by mouth every 6 (six) hours as needed. 01/20/12   Clearnce Sorrel, MD  hydrOXYzine (ATARAX) 25 MG tablet Take 25 mg by mouth 3 (three) times daily as needed.      [provider]  ketoprofen (ORUDIS) 75 MG capsule Take 1 capsule (75 mg total) by mouth 3 (three) times daily as needed. 01/20/12   Clearnce Sorrel, MD  levalbuterol Penne Lash) 1.25 MG/3ML nebulizer solution Take 1 ampule by nebulization every 4 (four) hours as needed.      [provider]  Loratadine (CLARITIN) 10 MG CAPS Take 1 capsule by mouth daily.      [provider]  metoCLOPramide (REGLAN) 10 MG tablet Take 10 mg by mouth as needed.      [provider]  mometasone (NASONEX) 50 MCG/ACT nasal spray Place 2 sprays into the nose daily.     [provider]  montelukast (SINGULAIR) 10 MG tablet Take 10 mg by mouth at bedtime.     [provider]  pantoprazole (PROTONIX) 40 MG tablet Take 40 mg by mouth daily.      [provider]  topiramate (TOPAMAX) 100 MG tablet Take 1 tablet (100 mg total) by mouth at bedtime. 01/20/12 01/19/13  Clearnce Sorrel, MD  topiramate (TOPAMAX) 25 MG tablet start with 3 tabs at  night for 1 week, then increase to 4 tabs at night from then. 11/10/11   Clearnce Sorrel, MD  traMADol (ULTRAM) 50 MG tablet Take 50 mg by mouth every 6 (six) hours as needed.      [provider]    ALLERGIES:  Allergies  Allergen Reactions  . Latex Rash and Other (See Comments)    wheezing  . Pork-Derived Products     SOCIAL HISTORY:  Social History   Tobacco Use  . Smoking status: Never Smoker  . Smokeless tobacco: Never Used  Substance Use Topics  . Alcohol use: No    FAMILY HISTORY: Family History  Problem Relation Age of Onset  . COPD Unknown        grandfather  . Allergies Unknown        grandfather  . Asthma Maternal  Grandfather   . Breast cancer Maternal Grandmother   . Heart disease Maternal Grandmother   . Lymphoma Maternal Grandmother        Non-Hodgkin's  . Colon cancer Neg Hx     EXAM: BP (!) 143/86 (BP Location: Right Arm)   Pulse 89   Temp 98.2 F (36.8 C) (Oral)   Resp 18   Ht 5' 4"  (1.626 m)   Wt 69.4 kg (153 lb)   LMP 06/10/2017   SpO2 100%   BMI 26.26 kg/m  CONSTITUTIONAL: Alert and oriented and responds appropriately to questions. Well-appearing; well-nourished HEAD: Normocephalic EYES: Conjunctivae clear, pupils appear equal, EOMI ENT: normal nose; moist mucous membranes NECK: Supple, no meningismus, no nuchal rigidity, no LAD  CARD: RRR; S1 and S2 appreciated; no murmurs, no clicks, no rubs, no gallops RESP: Normal chest excursion without splinting or tachypnea; breath sounds clear and equal bilaterally; no wheezes, no rhonchi, no rales, no hypoxia or respiratory distress, speaking full sentences ABD/GI: Normal bowel sounds; non-distended; soft, non-tender, no rebound, no guarding, no peritoneal signs, no hepatosplenomegaly BACK:  The back appears normal and is non-tender to palpation, there is no CVA tenderness EXT: Normal ROM in all joints; non-tender to palpation; no edema; normal capillary refill; no cyanosis, no calf tenderness or swelling    SKIN: Normal color for age and race; warm; no rash NEURO: Moves all extremities equally, normal sensation diffusely, normal gait, normal speech PSYCH: The patient's mood and manner are appropriate. Grooming and personal hygiene are appropriate.  MEDICAL DECISION MAKING: Patient here with complaints of flank pain.  Not reproducible with palpation or movement.  Urine shows no sign of infection or dehydration.  Labs show mild leukocytosis with left shift.  Otherwise unremarkable.  Will obtain a CT of her abdomen pelvis for further evaluation.  Abdominal exam is benign.  Will give IV fluids, morphine, Zofran for symptomatic relief.  ED  PROGRESS: CT scan shows 5 scattered nonobstructing 1-3 mm stones throughout both kidneys.  No hydronephrosis.  No ureteral stones.  Appendix appears normal.  She does have a 1.8 right adnexal cyst has no tenderness in this area.  I doubt that this is the cause of her symptoms today.  She could have recently passed a kidney stone.  Patient reports some improvement with morphine but still requesting further pain medicine.  Will give dose of Dilaudid and Toradol and reassess.   Pain significantly improved after Dilaudid and Toradol.  She will follow-up with her outpatient urologist.  She has hydrocodone at home and is on a pain contract.  Pain could be from recent passed kidney stone.  Will discharge with  prescription of Zofran for nausea and work note.  Patient comfortable with this plan.   At this time, I do not feel there is any life-threatening condition present. I have reviewed and discussed all results (EKG, imaging, lab, urine as appropriate) and exam findings with patient/family. I have reviewed nursing notes and appropriate previous records.  I feel the patient is safe to be discharged home without further emergent workup and can continue workup as an outpatient as needed. Discussed usual and customary return precautions. Patient/family verbalize understanding and are comfortable with this plan.  Outpatient follow-up has been provided if needed. All questions have been answered.      Caralina Nop, Delice Bison, DO 06/29/17 0506    Tanayah Squitieri, Delice Bison, DO 06/29/17 9847

## 2017-06-29 MED ORDER — ONDANSETRON 4 MG PO TBDP: 4.0000 mg | ORAL_TABLET | Freq: Three times a day (TID) | ORAL | 0 refills | Status: DC | PRN

## 2017-06-29 MED ORDER — HYDROMORPHONE HCL 1 MG/ML IJ SOLN
1.0000 mg | Freq: Once | INTRAMUSCULAR | Status: AC
  Administered 2017-06-29: 1 mg via INTRAVENOUS
  Filled 2017-06-29: qty 1

## 2017-06-29 MED ORDER — KETOROLAC TROMETHAMINE 30 MG/ML IJ SOLN
30.0000 mg | Freq: Once | INTRAMUSCULAR | Status: AC
  Administered 2017-06-29: 30 mg via INTRAVENOUS
  Filled 2017-06-29: qty 1

## 2017-06-30 DIAGNOSIS — D72829 Elevated white blood cell count, unspecified: Secondary | ICD-10-CM | POA: Diagnosis not present

## 2017-06-30 DIAGNOSIS — R109 Unspecified abdominal pain: Secondary | ICD-10-CM | POA: Diagnosis not present

## 2017-06-30 DIAGNOSIS — N200 Calculus of kidney: Secondary | ICD-10-CM | POA: Diagnosis not present

## 2017-06-30 DIAGNOSIS — R110 Nausea: Secondary | ICD-10-CM | POA: Diagnosis not present

## 2017-06-30 DIAGNOSIS — N3010 Interstitial cystitis (chronic) without hematuria: Secondary | ICD-10-CM | POA: Diagnosis not present

## 2017-06-30 MED FILL — TAMSULOSIN HCL 0.4 MG CAP: 0.4 | 30 days supply | Qty: 30 | Fill #0

## 2017-06-30 MED FILL — ONDANSETRON ODT 4 MG TABLET: 4 | 7 days supply | Qty: 20 | Fill #0

## 2017-06-30 MED FILL — SULFAMETHOXAZOLE/TMP DS TAB: 800-160 | 3 days supply | Qty: 6 | Fill #0

## 2017-07-07 DIAGNOSIS — E2839 Other primary ovarian failure: Secondary | ICD-10-CM | POA: Diagnosis not present

## 2017-07-07 DIAGNOSIS — Z3181 Encounter for male factor infertility in female patient: Secondary | ICD-10-CM | POA: Diagnosis not present

## 2017-07-07 DIAGNOSIS — Z319 Encounter for procreative management, unspecified: Secondary | ICD-10-CM | POA: Diagnosis not present

## 2017-07-14 MED FILL — LEVOTHYROXINE 112 MCG TAB: 112 | 30 days supply | Qty: 30 | Fill #5

## 2017-07-14 MED FILL — MONTELUKAST SOD 10 MG TAB: 10 | 90 days supply | Qty: 90 | Fill #0

## 2017-07-25 MED FILL — HYDROCODON-APAP 7.5-325: 7.5-325 | 30 days supply | Qty: 90 | Fill #0

## 2017-07-28 DIAGNOSIS — N3010 Interstitial cystitis (chronic) without hematuria: Secondary | ICD-10-CM | POA: Diagnosis not present

## 2017-07-28 DIAGNOSIS — R112 Nausea with vomiting, unspecified: Secondary | ICD-10-CM | POA: Diagnosis not present

## 2017-07-28 DIAGNOSIS — N200 Calculus of kidney: Secondary | ICD-10-CM | POA: Diagnosis not present

## 2017-07-28 MED FILL — ONDANSETRON HCL 8 MG TABLET: 8 | 20 days supply | Qty: 60 | Fill #0

## 2017-08-01 MED FILL — OSELTAMIVIR PHOSPHATE 75 MG: 75 | 10 days supply | Qty: 10 | Fill #0

## 2017-08-11 DIAGNOSIS — R112 Nausea with vomiting, unspecified: Secondary | ICD-10-CM | POA: Diagnosis not present

## 2017-08-11 DIAGNOSIS — N3010 Interstitial cystitis (chronic) without hematuria: Secondary | ICD-10-CM | POA: Diagnosis not present

## 2017-08-11 DIAGNOSIS — R109 Unspecified abdominal pain: Secondary | ICD-10-CM | POA: Diagnosis not present

## 2017-08-11 DIAGNOSIS — N200 Calculus of kidney: Secondary | ICD-10-CM | POA: Diagnosis not present

## 2017-08-15 DIAGNOSIS — N200 Calculus of kidney: Secondary | ICD-10-CM | POA: Insufficient documentation

## 2017-08-15 DIAGNOSIS — N3010 Interstitial cystitis (chronic) without hematuria: Secondary | ICD-10-CM | POA: Diagnosis not present

## 2017-08-15 DIAGNOSIS — N9489 Other specified conditions associated with female genital organs and menstrual cycle: Secondary | ICD-10-CM | POA: Diagnosis not present

## 2017-08-15 MED FILL — diazePAM 10 MG TABS: 10 | 30 days supply | Qty: 30 | Fill #1

## 2017-08-18 MED FILL — HYDROCODON-APAP 7.5-325: 7.5-325 | 30 days supply | Qty: 90 | Fill #0

## 2017-08-21 DIAGNOSIS — Z91041 Radiographic dye allergy status: Secondary | ICD-10-CM | POA: Diagnosis not present

## 2017-08-21 DIAGNOSIS — N200 Calculus of kidney: Secondary | ICD-10-CM | POA: Diagnosis not present

## 2017-08-21 DIAGNOSIS — Z79899 Other long term (current) drug therapy: Secondary | ICD-10-CM | POA: Diagnosis not present

## 2017-08-21 DIAGNOSIS — Z79890 Hormone replacement therapy (postmenopausal): Secondary | ICD-10-CM | POA: Diagnosis not present

## 2017-08-21 DIAGNOSIS — Z79891 Long term (current) use of opiate analgesic: Secondary | ICD-10-CM | POA: Diagnosis not present

## 2017-08-21 DIAGNOSIS — N3010 Interstitial cystitis (chronic) without hematuria: Secondary | ICD-10-CM | POA: Diagnosis not present

## 2017-08-21 DIAGNOSIS — G43909 Migraine, unspecified, not intractable, without status migrainosus: Secondary | ICD-10-CM | POA: Diagnosis not present

## 2017-08-21 DIAGNOSIS — Z7951 Long term (current) use of inhaled steroids: Secondary | ICD-10-CM | POA: Diagnosis not present

## 2017-08-21 DIAGNOSIS — J45909 Unspecified asthma, uncomplicated: Secondary | ICD-10-CM | POA: Diagnosis not present

## 2017-08-22 MED FILL — PHENAZOPYRIDINE 200 MG TAB: 200 | 3 days supply | Qty: 10 | Fill #0

## 2017-08-22 MED FILL — OXYBUTYNIN CL ER 10 MG TAB: 10 | 30 days supply | Qty: 30 | Fill #0

## 2017-08-22 MED FILL — TAMSULOSIN HCL 0.4 MG CAP: 0.4 | 30 days supply | Qty: 30 | Fill #0

## 2017-08-23 DIAGNOSIS — N390 Urinary tract infection, site not specified: Secondary | ICD-10-CM | POA: Diagnosis not present

## 2017-08-23 DIAGNOSIS — G8918 Other acute postprocedural pain: Secondary | ICD-10-CM | POA: Diagnosis not present

## 2017-08-23 DIAGNOSIS — N3010 Interstitial cystitis (chronic) without hematuria: Secondary | ICD-10-CM | POA: Diagnosis not present

## 2017-08-23 DIAGNOSIS — Z87442 Personal history of urinary calculi: Secondary | ICD-10-CM | POA: Diagnosis not present

## 2017-08-23 DIAGNOSIS — R8290 Unspecified abnormal findings in urine: Secondary | ICD-10-CM | POA: Diagnosis not present

## 2017-08-23 DIAGNOSIS — Z466 Encounter for fitting and adjustment of urinary device: Secondary | ICD-10-CM | POA: Diagnosis not present

## 2017-08-23 DIAGNOSIS — R319 Hematuria, unspecified: Secondary | ICD-10-CM | POA: Diagnosis not present

## 2017-08-23 MED FILL — ONDANSETRON ODT 8 MG TABLET: 8 | 10 days supply | Qty: 30 | Fill #0

## 2017-08-25 ENCOUNTER — Encounter (HOSPITAL_BASED_OUTPATIENT_CLINIC_OR_DEPARTMENT_OTHER): Payer: Self-pay | Admitting: Emergency Medicine

## 2017-08-25 ENCOUNTER — Other Ambulatory Visit: Payer: Self-pay

## 2017-08-25 ENCOUNTER — Emergency Department (HOSPITAL_BASED_OUTPATIENT_CLINIC_OR_DEPARTMENT_OTHER)
Admission: EM | Admit: 2017-08-25 | Discharge: 2017-08-25 | Disposition: A | Discharge status: Home / Self Care | Payer: 59 | Attending: Emergency Medicine | Admitting: Emergency Medicine

## 2017-08-25 ENCOUNTER — Emergency Department (HOSPITAL_BASED_OUTPATIENT_CLINIC_OR_DEPARTMENT_OTHER): Payer: 59

## 2017-08-25 DIAGNOSIS — R109 Unspecified abdominal pain: Secondary | ICD-10-CM | POA: Diagnosis present

## 2017-08-25 DIAGNOSIS — J45909 Unspecified asthma, uncomplicated: Secondary | ICD-10-CM | POA: Diagnosis not present

## 2017-08-25 DIAGNOSIS — N132 Hydronephrosis with renal and ureteral calculous obstruction: Secondary | ICD-10-CM | POA: Diagnosis not present

## 2017-08-25 DIAGNOSIS — Z79899 Other long term (current) drug therapy: Secondary | ICD-10-CM | POA: Insufficient documentation

## 2017-08-25 DIAGNOSIS — R110 Nausea: Secondary | ICD-10-CM | POA: Insufficient documentation

## 2017-08-25 DIAGNOSIS — R638 Other symptoms and signs concerning food and fluid intake: Secondary | ICD-10-CM | POA: Diagnosis not present

## 2017-08-25 DIAGNOSIS — N12 Tubulo-interstitial nephritis, not specified as acute or chronic: Secondary | ICD-10-CM | POA: Insufficient documentation

## 2017-08-25 DIAGNOSIS — Z91040 Latex allergy status: Secondary | ICD-10-CM | POA: Diagnosis not present

## 2017-08-25 LAB — BASIC METABOLIC PANEL
Anion gap: 8 (ref 5–15)
BUN: 11 mg/dL (ref 6–20)
CO2: 26 mmol/L (ref 22–32)
Calcium: 9.1 mg/dL (ref 8.9–10.3)
Chloride: 104 mmol/L (ref 101–111)
Creatinine, Ser: 1 mg/dL (ref 0.44–1.00)
GFR calc Af Amer: >60 mL/min (ref >60)
GFR calc non Af Amer: >60 mL/min (ref >60)
Glucose, Bld: 104 mg/dL — ABNORMAL HIGH (ref 65–99)
Potassium: 3.2 mmol/L — ABNORMAL LOW (ref 3.5–5.1)
Sodium: 138 mmol/L (ref 135–145)

## 2017-08-25 LAB — CBC WITH DIFFERENTIAL/PLATELET
Basophils Absolute: 0 10*3/uL (ref 0.0–0.1)
Basophils Relative: 0 %
Eosinophils Absolute: 0.4 10*3/uL (ref 0.0–0.7)
Eosinophils Relative: 3 %
HCT: 38.1 % (ref 36.0–46.0)
Hemoglobin: 12.9 g/dL (ref 12.0–15.0)
Lymphocytes Relative: 8 %
Lymphs Abs: 1.4 10*3/uL (ref 0.7–4.0)
MCH: 29.7 pg (ref 26.0–34.0)
MCHC: 33.9 g/dL (ref 30.0–36.0)
MCV: 87.6 fL (ref 78.0–100.0)
Monocytes Absolute: 1.2 10*3/uL — ABNORMAL HIGH (ref 0.1–1.0)
Monocytes Relative: 7 %
Neutro Abs: 13.8 10*3/uL — ABNORMAL HIGH (ref 1.7–7.7)
Neutrophils Relative %: 82 %
Platelets: 313 10*3/uL (ref 150–400)
RBC: 4.35 MIL/uL (ref 3.87–5.11)
RDW: 14 % (ref 11.5–15.5)
WBC: 16.8 10*3/uL — ABNORMAL HIGH (ref 4.0–10.5)

## 2017-08-25 LAB — URINALYSIS, MICROSCOPIC (REFLEX)

## 2017-08-25 LAB — URINALYSIS, ROUTINE W REFLEX MICROSCOPIC
Bilirubin Urine: NEGATIVE
Glucose, UA: NEGATIVE mg/dL
Ketones, ur: 15 mg/dL — AB
Leukocytes, UA: NEGATIVE
Nitrite: POSITIVE — AB
Protein, ur: 100 mg/dL — AB
Specific Gravity, Urine: >1.03 — ABNORMAL HIGH (ref 1.005–1.030)
pH: 6 (ref 5.0–8.0)

## 2017-08-25 MED ORDER — METOCLOPRAMIDE HCL 5 MG/ML IJ SOLN
5.0000 mg | Freq: Once | INTRAMUSCULAR | Status: AC
  Administered 2017-08-25: 5 mg via INTRAVENOUS
  Filled 2017-08-25: qty 2

## 2017-08-25 MED ORDER — ONDANSETRON HCL 4 MG/2ML IJ SOLN
4.0000 mg | Freq: Once | INTRAMUSCULAR | Status: AC
  Administered 2017-08-25: 4 mg via INTRAVENOUS
  Filled 2017-08-25: qty 2

## 2017-08-25 MED ORDER — SODIUM CHLORIDE 0.9 % IV SOLN
1.0000 g | Freq: Once | INTRAVENOUS | Status: AC
  Administered 2017-08-25: 1 g via INTRAVENOUS
  Filled 2017-08-25: qty 10

## 2017-08-25 MED ORDER — HYDROMORPHONE HCL 1 MG/ML IJ SOLN
0.5000 mg | Freq: Once | INTRAMUSCULAR | Status: AC
  Administered 2017-08-25: 0.5 mg via INTRAVENOUS
  Filled 2017-08-25: qty 1

## 2017-08-25 MED ORDER — SODIUM CHLORIDE 0.9 % IV BOLUS (SEPSIS)
500.0000 mL | Freq: Once | INTRAVENOUS | Status: AC
  Administered 2017-08-25: 500 mL via INTRAVENOUS

## 2017-08-25 MED ORDER — POTASSIUM CHLORIDE CRYS ER 20 MEQ PO TBCR
40.0000 meq | EXTENDED_RELEASE_TABLET | Freq: Once | ORAL | Status: AC
  Administered 2017-08-25: 40 meq via ORAL
  Filled 2017-08-25: qty 2

## 2017-08-25 MED ORDER — KETOROLAC TROMETHAMINE 30 MG/ML IJ SOLN
30.0000 mg | Freq: Once | INTRAMUSCULAR | Status: AC
  Administered 2017-08-25: 30 mg via INTRAVENOUS
  Filled 2017-08-25: qty 1

## 2017-08-25 MED ORDER — SODIUM CHLORIDE 0.9 % IV BOLUS (SEPSIS)
1000.0000 mL | Freq: Once | INTRAVENOUS | Status: AC
  Administered 2017-08-25: 1000 mL via INTRAVENOUS

## 2017-08-25 MED ORDER — OXYCODONE-ACETAMINOPHEN 5-325 MG PO TABS: 1.0000 | ORAL_TABLET | Freq: Three times a day (TID) | ORAL | 0 refills | Status: DC | PRN

## 2017-08-25 MED ORDER — CIPROFLOXACIN HCL 500 MG PO TABS: 500.0000 mg | ORAL_TABLET | Freq: Two times a day (BID) | ORAL | 0 refills | Status: AC

## 2017-08-25 NOTE — ED Triage Notes (Signed)
Patient states that she is having right sided flank pain. She reports that she had a stent in her kidney and they took it out Wed  - The patient reports that she has had continued pain to her back worsening today

## 2017-08-25 NOTE — ED Provider Notes (Signed)
[facility] EMERGENCY DEPARTMENT Provider Note   CSN: [id number] Arrival date & time: 08/25/17  1719     History   Chief Complaint Chief Complaint  Patient presents with  . Flank Pain    HPI [name] is a 42 y.o. female with past medical history of kidney stones, chronic interstitial cystitis, ulcerative colitis, resents to ED for evaluation of severe, worsening right-sided back pain for the past 12 hours.  Today is Friday. She had stents placed for her kidney stones on Monday and were removed on Wednesday.  She experienced some pain afterwards but states that the pain got acutely worse last night.  She has been taking her Ditropan, hydrocodone with no improvement in her symptoms.  She also reports decrease in urine and p.o. intake secondary to nausea.  Also reports fever of 101 yesterday.  This is her first time with stent placement.  Denies any hematuria, diarrhea, constipation, hematemesis.  HPI  Past Medical History:  Diagnosis Date  . Asthma   . Bronchitis   . Chronic interstitial cystitis   . GERD (gastroesophageal reflux disease)   . History of migraine headaches   . Hyperlipidemia   . Interstitial cystitis   . Rhinosinusitis   . Swollen lymph nodes    right side neck  . Ulcerative colitis [facility])     Patient Active Problem List   Diagnosis Date Noted  . Migraine headache 07/12/2011  . Abdominal migraine 07/12/2011  . Chronic, episodice nausea and vomiting. ? abdomnal migraine 10/26/2010  . Chronic, recurrent epigastric pain. ? Abdominal migraine.Abdominal pain, epigastric 10/26/2010  . ASTHMA 09/27/2007  . ALLERGY 09/27/2007    Past Surgical History:  Procedure Laterality Date  . CESAREAN SECTION  4010,[id number]   x2  . DILATION AND CURETTAGE OF UTERUS  2003  . TONSILECTOMY, ADENOIDECTOMY, BILATERAL MYRINGOTOMY AND TUBES    . UPPER GASTROINTESTINAL ENDOSCOPY  05/11/2005; 12/21/2010   2006 Normal; 2012 erosive gastritis    OB History    No  data available       Home Medications    Prior to Admission medications   Medication Sig Start Date End Date Taking? Authorizing Provider  azelastine (ASTEPRO) 137 MCG/SPRAY nasal spray 1 spray by Nasal route 2 (two) times daily. Use in each nostril as directed     [provider]  budesonide-formoterol (SYMBICORT) 160-4.5 MCG/ACT inhaler Inhale 2 puffs into the lungs 2 (two) times daily.      [provider]  ciprofloxacin (CIPRO) 500 MG tablet Take 1 tablet (500 mg total) by mouth every 12 (twelve) hours for 7 days. 08/25/17 09/01/17  Tinlee Navarrette, PA-C  eletriptan (RELPAX) 40 MG tablet One tablet by mouth at onset of headache. May repeat in 2 hours if headache persists or recurs. 01/20/12 01/19/13  Clearnce Sorrel, MD  HYDROcodone-acetaminophen (VICODIN ES) 7.5-750 MG per tablet Take 1 tablet by mouth every 6 (six) hours as needed. 01/20/12   Clearnce Sorrel, MD  hydrOXYzine (ATARAX) 25 MG tablet Take 25 mg by mouth 3 (three) times daily as needed.      [provider]  ketoprofen (ORUDIS) 75 MG capsule Take 1 capsule (75 mg total) by mouth 3 (three) times daily as needed. 01/20/12   Clearnce Sorrel, MD  levalbuterol Penne Lash) 1.25 MG/3ML nebulizer solution Take 1 ampule by nebulization every 4 (four) hours as needed.      [provider]  Loratadine (CLARITIN) 10 MG CAPS Take 1 capsule by mouth daily.  [provider]  metoCLOPramide (REGLAN) 10 MG tablet Take 10 mg by mouth as needed.      [provider]  mometasone (NASONEX) 50 MCG/ACT nasal spray Place 2 sprays into the nose daily.     [provider]  montelukast (SINGULAIR) 10 MG tablet Take 10 mg by mouth at bedtime.     [provider]  ondansetron (ZOFRAN ODT) 4 MG disintegrating tablet Take 1 tablet (4 mg total) by mouth every 8 (eight) hours as needed for nausea or vomiting. 06/29/17   Ward, Delice Bison, DO  oxyCODONE-acetaminophen (PERCOCET/ROXICET) 5-325 MG tablet  Take 1 tablet by mouth every 8 (eight) hours as needed for severe pain. 08/25/17   Marchia Diguglielmo, PA-C  pantoprazole (PROTONIX) 40 MG tablet Take 40 mg by mouth daily.      [provider]  topiramate (TOPAMAX) 100 MG tablet Take 1 tablet (100 mg total) by mouth at bedtime. 01/20/12 01/19/13  Clearnce Sorrel, MD  topiramate (TOPAMAX) 25 MG tablet start with 3 tabs at night for 1 week, then increase to 4 tabs at night from then. 11/10/11   Clearnce Sorrel, MD  traMADol (ULTRAM) 50 MG tablet Take 50 mg by mouth every 6 (six) hours as needed.      [provider]    Family History Family History  Problem Relation Age of Onset  . COPD Unknown        grandfather  . Allergies Unknown        grandfather  . Asthma Maternal Grandfather   . Breast cancer Maternal Grandmother   . Heart disease Maternal Grandmother   . Lymphoma Maternal Grandmother        Non-Hodgkin's  . Colon cancer Neg Hx     Social History Social History   Tobacco Use  . Smoking status: Never Smoker  . Smokeless tobacco: Never Used  Substance Use Topics  . Alcohol use: No  . Drug use: No     Allergies   Latex and Pork-derived products   Review of Systems Review of Systems  Constitutional: Negative for appetite change, chills and fever.  HENT: Negative for ear pain, rhinorrhea, sneezing and sore throat.   Eyes: Negative for photophobia and visual disturbance.  Respiratory: Negative for cough, chest tightness, shortness of breath and wheezing.   Cardiovascular: Negative for chest pain and palpitations.  Gastrointestinal: Negative for abdominal pain, blood in stool, constipation, diarrhea, nausea and vomiting.  Genitourinary: Positive for flank pain. Negative for dysuria, hematuria and urgency.  Musculoskeletal: Negative for myalgias.  Skin: Negative for rash.  Neurological: Negative for dizziness, weakness and light-headedness.     Physical Exam Updated Vital Signs BP 108/65   Pulse 97   Temp  98.4 F (36.9 C) (Oral)   Resp 15   Ht 5\' 4"  (1.626 m)   Wt 68 kg (150 lb)   LMP 08/18/2017   SpO2 98%   BMI 25.75 kg/m   Physical Exam  Constitutional: She appears well-developed and well-nourished. No distress.  HENT:  Head: Normocephalic and atraumatic.  Nose: Nose normal.  Eyes: Conjunctivae and EOM are normal. Left eye exhibits no discharge. No scleral icterus.  Neck: Normal range of motion. Neck supple.  Cardiovascular: Normal rate, regular rhythm, normal heart sounds and intact distal pulses. Exam reveals no gallop and no friction rub.  No murmur heard. Pulmonary/Chest: Effort normal and breath sounds normal. No respiratory distress.  Abdominal: Soft. Bowel sounds are normal. She exhibits no distension. There  is no tenderness. There is no guarding.  Right-sided CVA tenderness.  Musculoskeletal: Normal range of motion. She exhibits no edema.  Neurological: She is alert. She exhibits normal muscle tone. Coordination normal.  Skin: Skin is warm and dry. No rash noted.  Psychiatric: She has a normal mood and affect.  Nursing note and vitals reviewed.    ED Treatments / Results  Labs (all labs ordered are listed, but only abnormal results are displayed) Labs Reviewed  URINALYSIS, ROUTINE W REFLEX MICROSCOPIC - Abnormal; Notable for the following components:      Result Value   APPearance CLOUDY (*)    Specific Gravity, Urine >1.030 (*)    Hgb urine dipstick LARGE (*)    Ketones, ur 15 (*)    Protein, ur 100 (*)    Nitrite POSITIVE (*)    All other components within normal limits  BASIC METABOLIC PANEL - Abnormal; Notable for the following components:   Potassium 3.2 (*)    Glucose, Bld 104 (*)    All other components within normal limits  CBC WITH DIFFERENTIAL/PLATELET - Abnormal; Notable for the following components:   WBC 16.8 (*)    Neutro Abs 13.8 (*)    Monocytes Absolute 1.2 (*)    All other components within normal limits  URINALYSIS, MICROSCOPIC (REFLEX)  - Abnormal; Notable for the following components:   Bacteria, UA FEW (*)    Squamous Epithelial / LPF 6-30 (*)    All other components within normal limits  URINE CULTURE    EKG  EKG Interpretation None       Radiology US Renal  Result Date: 08/25/2017 CLINICAL DATA:  Initial evaluation for acute severe right flank pain. Recent renal stent removal. EXAM: RENAL / URINARY TRACT ULTRASOUND COMPLETE COMPARISON:  Prior CT from 06/28/2017. FINDINGS: Right Kidney: Length: 11.6 cm. Echogenicity within normal limits. Shadowing 6 mm calculus present within the lower pole. Mild right hydronephrosis. Mild dilatation of the proximal right ureter. Trace perinephric fluid noted. Left Kidney: Length: 10.6 cm. Echogenicity within normal limits. No mass or hydronephrosis visualized. Bladder: Appears normal for degree of bladder distention. IMPRESSION: 1. Mild right hydronephrosis with proximal right hydroureter. Clinical correlation for possible obstructive ureterolithiasis recommended. 2. 6 mm nonobstructive right renal nephrolithiasis. 3. Normal sonographic evaluation of the left kidney. Electronically Signed   By: Jeannine Boga M.D.   On: 08/25/2017 20:53    Procedures Procedures (including critical care time)  Medications Ordered in ED Medications  HYDROmorphone (DILAUDID) injection 0.5 mg (0.5 mg Intravenous Given 08/25/17 1810)  ondansetron (ZOFRAN) injection 4 mg (4 mg Intravenous Given 08/25/17 1810)  sodium chloride 0.9 % bolus 1,000 mL (0 mLs Intravenous Stopped 08/25/17 1916)  metoCLOPramide (REGLAN) injection 5 mg (5 mg Intravenous Given 08/25/17 1841)  HYDROmorphone (DILAUDID) injection 0.5 mg (0.5 mg Intravenous Given 08/25/17 1842)  HYDROmorphone (DILAUDID) injection 0.5 mg (0.5 mg Intravenous Given 08/25/17 2037)  cefTRIAXone (ROCEPHIN) 1 g in sodium chloride 0.9 % 100 mL IVPB (0 g Intravenous Stopped 08/25/17 2203)  ketorolac (TORADOL) 30 MG/ML injection 30 mg (30 mg Intravenous Given  08/25/17 2121)  sodium chloride 0.9 % bolus 500 mL (0 mLs Intravenous Stopped 08/25/17 2203)     Initial Impression / Assessment and Plan / ED Course  I have reviewed the triage vital signs and the nursing notes.  Pertinent labs & imaging results that were available during my care of the patient were reviewed by me and considered in my medical decision making (see chart for  details).  Clinical Course as of Aug 26 2207  Fri Aug 25, 2017  1908 Improvement in pain and nausea reported.  [HK]    Clinical Course User Index [HK] Delia Heady, PA-C    Patient presents to ED for evaluation of severe, worsening right-sided back pain for the past 12 hours.  Today's Friday and she had stents placed in her kidney on the right side on Monday and it was removed on Wednesday.  She did have pain afterwards but the pain got acutely worse in the past 12 hours.  She has tried her home hydrocodone, acetaminophen with no improvement in her symptoms.  She also reports decrease in urine and p.o. intake secondary to nausea.  Reports fever of 101 yesterday.  On physical exam she does appear uncomfortable.  She does have some right-sided CVA tenderness.  No abdominal tenderness to palpation.  She is afebrile with no recent use of antipyretics.  CBC with leukocytosis at 16.8.  CMP with normal kidney function, mild hypokalemia at 3.2 which was repleted orally.  Urinalysis with evidence of UTI with positive nitrites and bacteria.  Patient was given several doses of Dilaudid with only mild improvement in her symptoms.  Nausea controlled with Zofran and Reglan given here.  I spoke to Dr. Rolena Infante of this urology who recommends obtaining an ultrasound of the kidneys to evaluate for any hydronephrosis or stones.  This did show mild right-sided hydronephrosis and mild hydroureter.  It did show a 6 mm nonobstructing stone as well.  I contacted Dr. Rolena Infante again who told me that I should asked the patient if she recommends another stent  placement that will likely be there for several weeks or if she would rather go home with antibiotics for pyelonephritis.  I spoke to the patient about this and she states that she would rather be treated at home with antibiotics.  She states that she did not tolerate the stent that was placed earlier in the week and she would not like to have another one.  Her pain was controlled here with Toradol and she was given her first dose of antibiotics here.  She is able to tolerate p.o. intake without difficulty.  Will give a few more doses of pain medication, ciprofloxacin for pyelonephritis and send urine for culture.  Advised to follow-up with her urologist for further evaluation.  Patient is agreeable to the plan.  Strict return precautions given.  Portions of this note were generated with [employer]. Dictation errors may occur despite best attempts at proofreading.   Final Clinical Impressions(s) / ED Diagnoses   Final diagnoses:  Pyelonephritis    ED Discharge Orders        Ordered    oxyCODONE-acetaminophen (PERCOCET/ROXICET) 5-325 MG tablet  Every 8 hours PRN     08/25/17 2208    ciprofloxacin (CIPRO) 500 MG tablet  Every 12 hours     08/25/17 2208       Delia Heady, PA-C 08/25/17 2214    Blanchie Dessert, MD 08/27/17 2027

## 2017-08-25 NOTE — ED Notes (Signed)
[facility] for consult to Urology

## 2017-08-25 NOTE — ED Notes (Signed)
[facility] for Urology consult.

## 2017-08-25 NOTE — ED Notes (Signed)
Pt verbalizes understanding of d/c instructions and denies any further needs at this time. 

## 2017-08-25 NOTE — ED Notes (Signed)
Patient in ultrasound.

## 2017-08-25 NOTE — ED Notes (Signed)
[facility] U[facility] (Dr. Alinda Money) for consult.

## 2017-08-25 NOTE — Discharge Instructions (Signed)
Please read attached information regarding your condition. Take ciprofloxacin twice daily for the next 7 days. We sent your urine for a culture.  If you need to be on a different antibiotic we will contact you to switch it. Take pain medication as needed. Follow-up with your urologist for further evaluation. Return to ED for worsening symptoms, increasing fevers, trouble with urination, severe back pain or abdominal pain, lightheadedness.

## 2017-08-25 NOTE — ED Notes (Signed)
ED Provider at bedside. 

## 2017-08-27 LAB — URINE CULTURE: Culture: NO GROWTH

## 2017-09-01 MED FILL — LEVOTHYROXINE 112 MCG TAB: 112 | 30 days supply | Qty: 30 | Fill #0

## 2017-09-05 DIAGNOSIS — N200 Calculus of kidney: Secondary | ICD-10-CM | POA: Diagnosis not present

## 2017-09-05 DIAGNOSIS — N3010 Interstitial cystitis (chronic) without hematuria: Secondary | ICD-10-CM | POA: Diagnosis not present

## 2017-09-18 MED FILL — HYDROCODON-APAP 7.5-325: 7.5-325 | 30 days supply | Qty: 90 | Fill #0

## 2017-09-21 MED FILL — hydrOXYzine HCL 25 MG TABS: 25 | 30 days supply | Qty: 90 | Fill #1

## 2017-10-04 MED FILL — diazePAM 10 MG TABS: 10 | 30 days supply | Qty: 30 | Fill #2

## 2017-10-06 MED FILL — valACYclovir HCL 1 GM TABS: 1 | 30 days supply | Qty: 30 | Fill #0

## 2017-10-10 MED FILL — LEVOTHYROXINE 112 MCG TAB: 112 | 30 days supply | Qty: 30 | Fill #1

## 2017-10-11 MED FILL — FLUCONAZOLE 150 MG TABS: 150 | 7 days supply | Qty: 3 | Fill #0

## 2017-10-19 MED FILL — HYDROCODON-APAP 7.5-325: 7.5-325 | 30 days supply | Qty: 90 | Fill #0

## 2017-11-13 MED FILL — SYMBICORT 160-4.5 MCG INH: 160-4.5 | 30 days supply | Qty: 10 | Fill #0

## 2017-11-13 MED FILL — VENTOLIN HFA 90 MCG INHALER: 108 (90 BAS | 50 days supply | Qty: 54 | Fill #1

## 2017-11-13 MED FILL — LEVOTHYROXINE 112 MCG TAB: 112 | 30 days supply | Qty: 30 | Fill #0

## 2017-11-13 MED FILL — MONTELUKAST SOD 10 MG TAB: 10 | 90 days supply | Qty: 90 | Fill #1

## 2017-11-14 MED FILL — TOPIRAMATE 100 MG TABLET: 100 | 90 days supply | Qty: 90 | Fill #0

## 2017-11-17 DIAGNOSIS — E039 Hypothyroidism, unspecified: Secondary | ICD-10-CM | POA: Diagnosis not present

## 2017-11-17 DIAGNOSIS — R600 Localized edema: Secondary | ICD-10-CM | POA: Diagnosis not present

## 2017-11-20 MED FILL — HYDROCODON-APAP 7.5-325: 7.5-325 | 30 days supply | Qty: 90 | Fill #0

## 2017-12-01 DIAGNOSIS — D721 Eosinophilia: Secondary | ICD-10-CM | POA: Diagnosis not present

## 2017-12-07 DIAGNOSIS — N3010 Interstitial cystitis (chronic) without hematuria: Secondary | ICD-10-CM | POA: Diagnosis not present

## 2017-12-07 DIAGNOSIS — R82998 Other abnormal findings in urine: Secondary | ICD-10-CM | POA: Diagnosis not present

## 2017-12-07 DIAGNOSIS — N200 Calculus of kidney: Secondary | ICD-10-CM | POA: Diagnosis not present

## 2017-12-07 DIAGNOSIS — R102 Pelvic and perineal pain: Secondary | ICD-10-CM | POA: Diagnosis not present

## 2017-12-07 MED FILL — hydrOXYzine HCL 25 MG TABS: 25 | 30 days supply | Qty: 90 | Fill #0

## 2017-12-07 MED FILL — POTASSIUM CITRATE ER 15 MEQ: 15 MEQ | 30 days supply | Qty: 60 | Fill #0

## 2017-12-07 MED FILL — ME-NAPHOS-MB-HYO 1 TABLET: 81.6 | 25 days supply | Qty: 100 | Fill #0

## 2017-12-07 MED FILL — OXYBUTYNIN CL ER 10 MG TAB: 10 | 30 days supply | Qty: 30 | Fill #0

## 2017-12-19 MED FILL — HYDROCODON-APAP 7.5-325: 7.5-325 | 30 days supply | Qty: 90 | Fill #0

## 2017-12-20 MED FILL — LEVOTHYROXINE 112 MCG TAB: 112 | 30 days supply | Qty: 30 | Fill #1

## 2017-12-27 DIAGNOSIS — R5383 Other fatigue: Secondary | ICD-10-CM | POA: Diagnosis not present

## 2017-12-27 DIAGNOSIS — M25571 Pain in right ankle and joints of right foot: Secondary | ICD-10-CM | POA: Diagnosis not present

## 2017-12-27 DIAGNOSIS — L659 Nonscarring hair loss, unspecified: Secondary | ICD-10-CM | POA: Diagnosis not present

## 2017-12-27 DIAGNOSIS — M791 Myalgia, unspecified site: Secondary | ICD-10-CM | POA: Diagnosis not present

## 2017-12-27 DIAGNOSIS — M25572 Pain in left ankle and joints of left foot: Secondary | ICD-10-CM | POA: Diagnosis not present

## 2017-12-27 DIAGNOSIS — R600 Localized edema: Secondary | ICD-10-CM | POA: Diagnosis not present

## 2018-01-04 DIAGNOSIS — H5213 Myopia, bilateral: Secondary | ICD-10-CM | POA: Diagnosis not present

## 2018-01-05 MED FILL — OXYBUTYNIN CL ER 10 MG TAB: 10 | 30 days supply | Qty: 30 | Fill #1

## 2018-01-05 MED FILL — HYDROCHLOROTHIAZIDE 12.5 MG: 12.5 | 90 days supply | Qty: 90 | Fill #0

## 2018-01-10 MED FILL — ESCITALOPRAM 5 MG TABLET: 5 | 90 days supply | Qty: 90 | Fill #0

## 2018-01-18 MED FILL — LEVOTHYROXINE 112 MCG TAB: 112 | 30 days supply | Qty: 30 | Fill #0

## 2018-01-19 MED FILL — HYDROCODON-APAP 7.5-325: 7.5-325 | 30 days supply | Qty: 90 | Fill #0

## 2018-02-16 MED FILL — hydrOXYzine HCL 25 MG TABS: 25 | 30 days supply | Qty: 90 | Fill #1

## 2018-02-19 MED FILL — HYDROCODON-APAP 7.5-325: 7.5-325 | 30 days supply | Qty: 90 | Fill #0

## 2018-02-23 DIAGNOSIS — L700 Acne vulgaris: Secondary | ICD-10-CM | POA: Diagnosis not present

## 2018-02-23 MED FILL — LEVOTHYROXINE 112 MCG TAB: 112 | 30 days supply | Qty: 30 | Fill #0

## 2018-02-23 MED FILL — SYMBICORT 160-4.5 MCG INH: 160-4.5 | 30 days supply | Qty: 10 | Fill #1

## 2018-02-23 MED FILL — MONTELUKAST SOD 10 MG TAB: 10 | 90 days supply | Qty: 90 | Fill #2

## 2018-02-23 MED FILL — VENTOLIN HFA 90 MCG INHALER: 108 (90 BAS | 17 days supply | Qty: 18 | Fill #0

## 2018-02-23 MED FILL — TOPIRAMATE 100 MG TABLET: 100 | 90 days supply | Qty: 90 | Fill #0

## 2018-03-08 DIAGNOSIS — N200 Calculus of kidney: Secondary | ICD-10-CM | POA: Diagnosis not present

## 2018-03-08 DIAGNOSIS — N3010 Interstitial cystitis (chronic) without hematuria: Secondary | ICD-10-CM | POA: Diagnosis not present

## 2018-03-08 DIAGNOSIS — R102 Pelvic and perineal pain: Secondary | ICD-10-CM | POA: Diagnosis not present

## 2018-03-08 DIAGNOSIS — N9489 Other specified conditions associated with female genital organs and menstrual cycle: Secondary | ICD-10-CM | POA: Diagnosis not present

## 2018-03-08 MED FILL — TAMSULOSIN HCL 0.4 MG CAP: 0.4 | 30 days supply | Qty: 30 | Fill #0

## 2018-03-21 MED FILL — HYDROCODON-APAP 7.5-325: 7.5-325 | 30 days supply | Qty: 90 | Fill #0

## 2018-03-23 IMAGING — CT CT RENAL STONE PROTOCOL
2 of 4 series · 16 of 46 positions shown, 18 images · non-contrast
Comparison: 09/14/2010 abdominal sonogram.

CLINICAL DATA: Right flank pain and nausea for 2 days. History of
ulcerative colitis and interstitial cystitis.

EXAM:
CT ABDOMEN AND PELVIS WITHOUT CONTRAST
TECHNIQUE: Multidetector CT imaging of the abdomen and pelvis was performed
following the standard protocol without IV contrast.

[Series 2: axial st · axial · 0.76mm/px · z∈[-612,-232]mm · 13 of 84 slices shown, 15 images]
[im 4/84  soft-tissue]
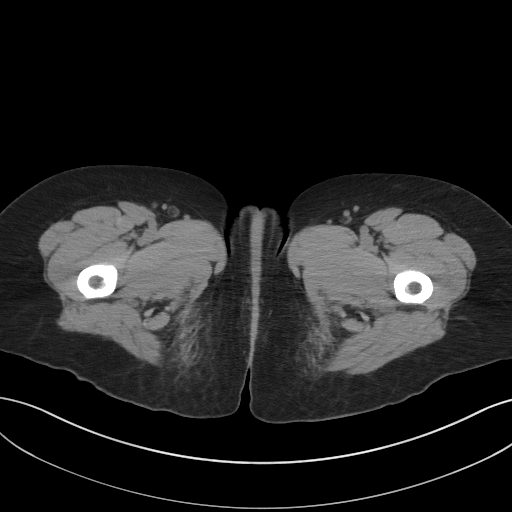
[im 4/84  bone]
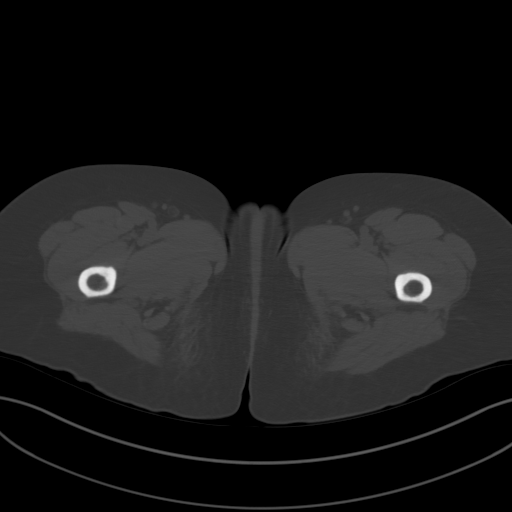
[im 10/84  soft-tissue]
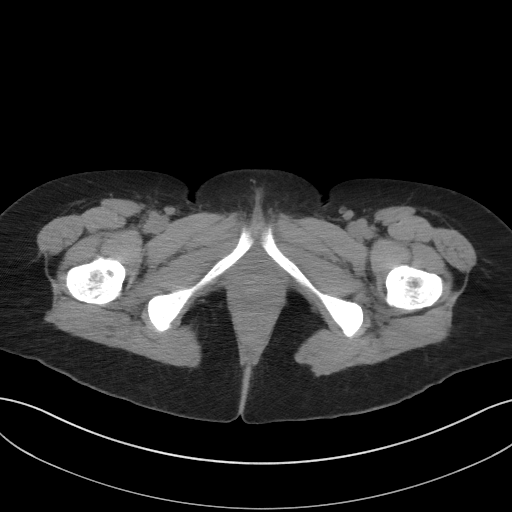
[im 17/84  soft-tissue]
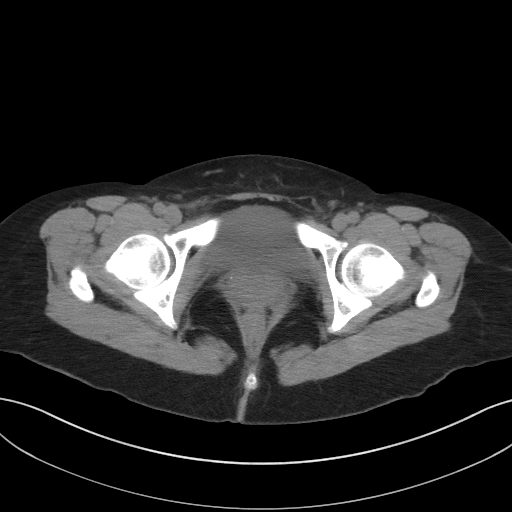
[im 24/84  soft-tissue]
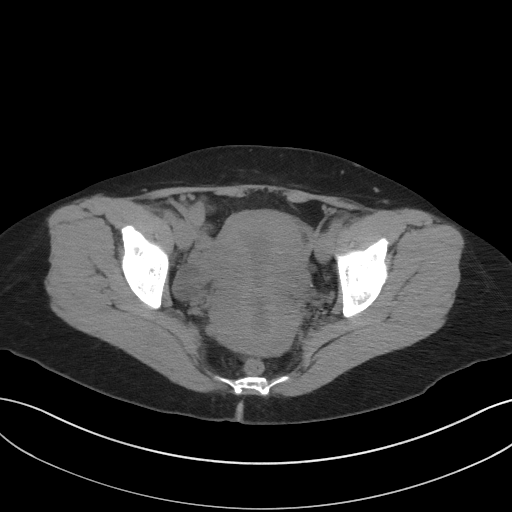
[im 30/84  soft-tissue]
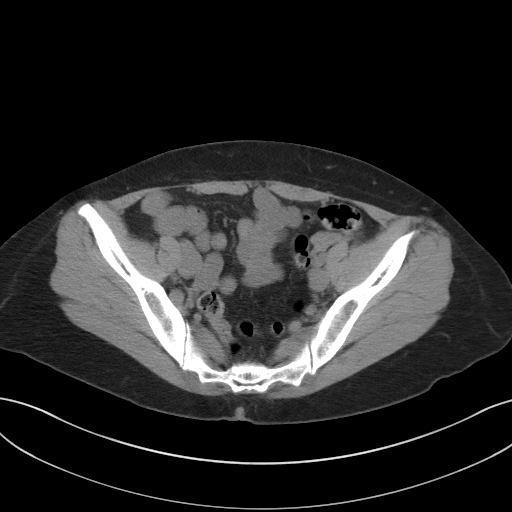
[im 37/84  soft-tissue]
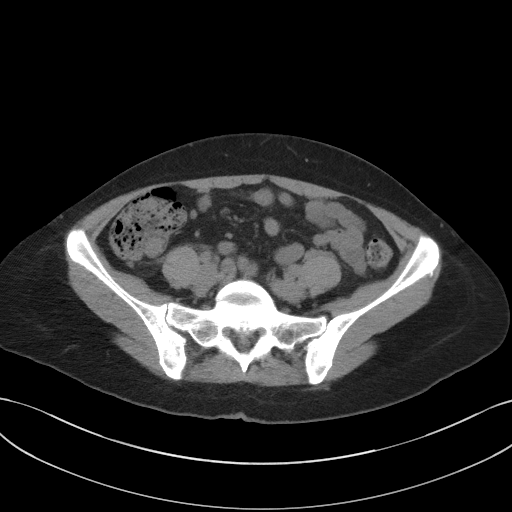
[im 44/84  soft-tissue]
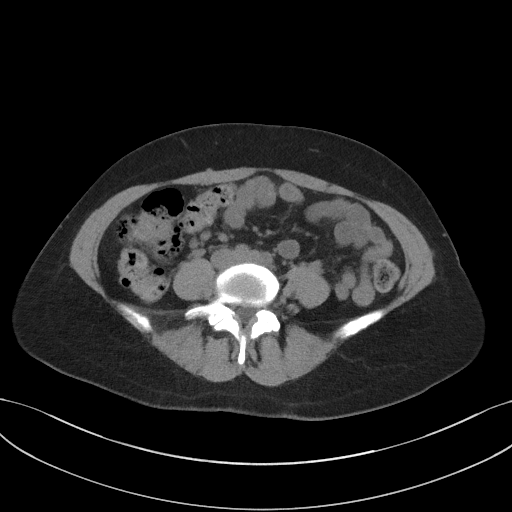
[im 47/84  soft-tissue]
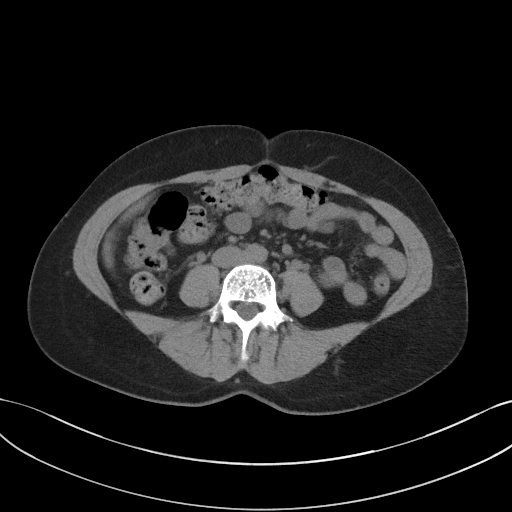
[im 54/84  soft-tissue]
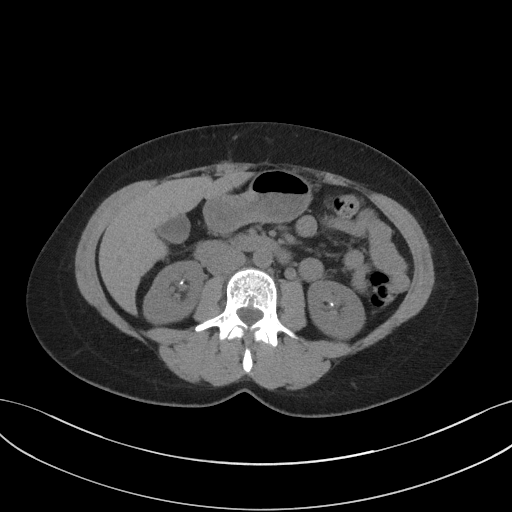
[im 54/84  bone]
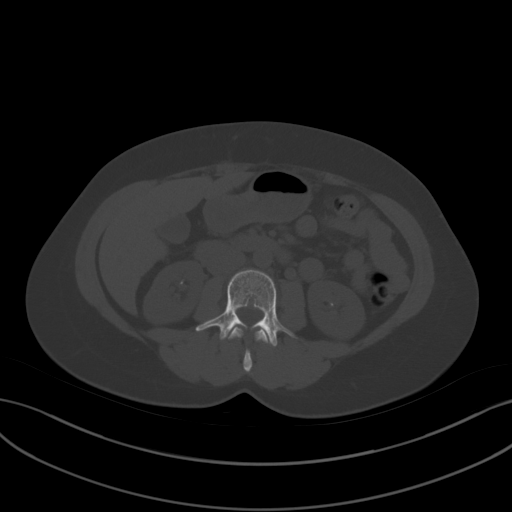
[im 60/84  soft-tissue]
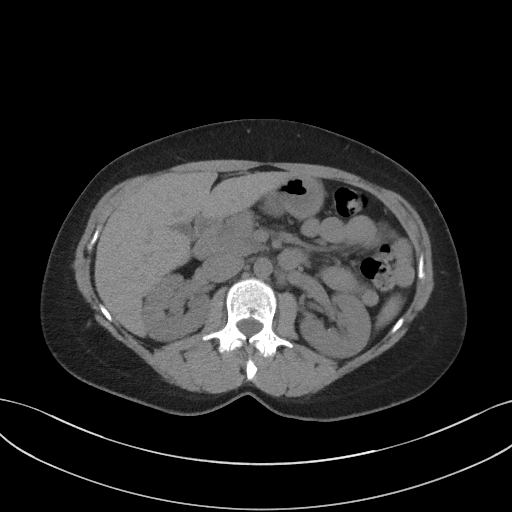
[im 67/84  soft-tissue]
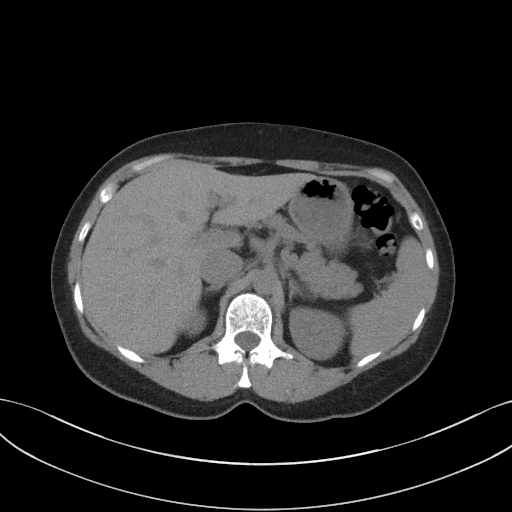
[im 74/84  soft-tissue]
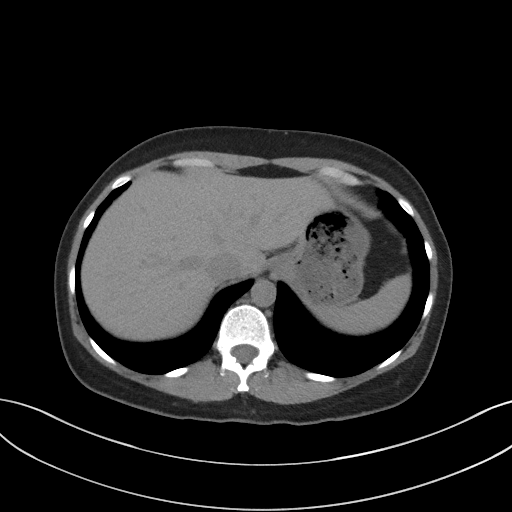
[im 80/84  soft-tissue]
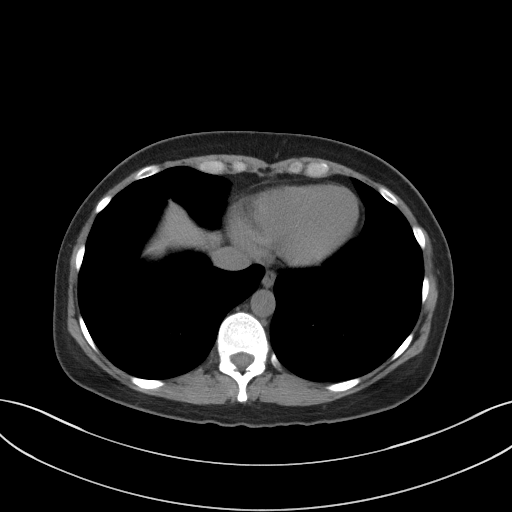

[Series 5: coronal st · coronal · 0.76mm/px · 3 of 92 slices shown]
[im 31/92  soft-tissue]
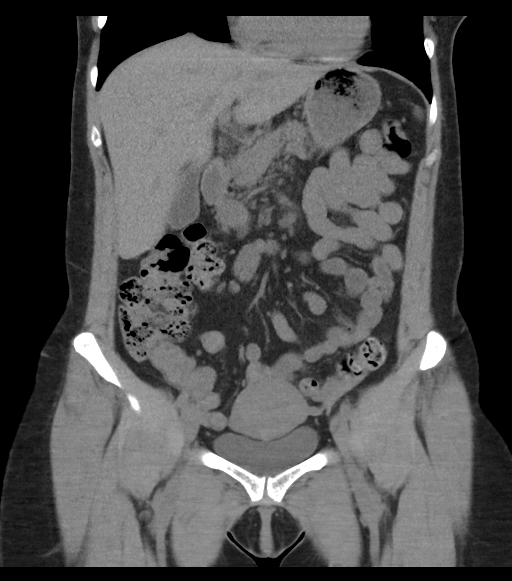
[im 41/92  soft-tissue]
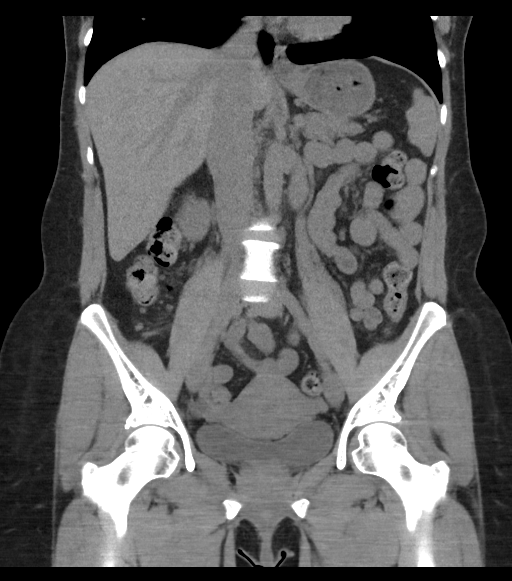
[im 51/92  soft-tissue]
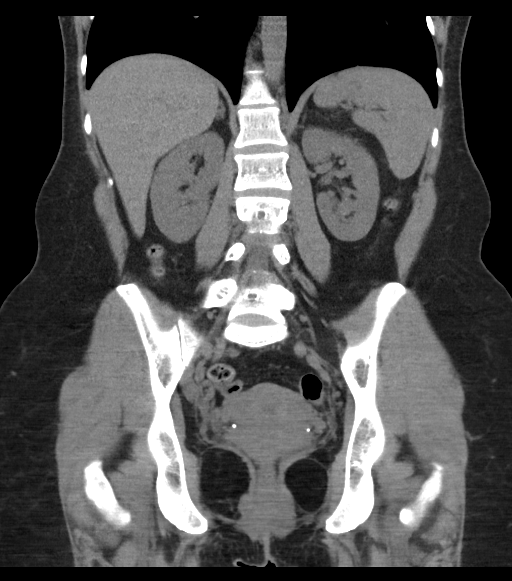

[16 of 46 positions shown; findings below may reference images not displayed]

FINDINGS: Lower chest: No significant pulmonary nodules or acute consolidative
airspace disease.

Hepatobiliary: Normal liver size. No liver mass. Normal gallbladder
with no radiopaque cholelithiasis. No biliary ductal dilatation.

Pancreas: Normal, with no mass or duct dilation.

Spleen: Normal size. No mass.

Adrenals/Urinary Tract: Normal adrenals. There are at least 5
scattered nonobstructing 1-3 mm stones throughout both kidneys. No
hydronephrosis. No contour deforming renal masses. Normal caliber
ureters, with no ureteral stones. Normal bladder.

Stomach/Bowel: Normal non-distended stomach. Normal caliber small
bowel with no small bowel wall thickening. Normal appendix. Normal
large bowel with no diverticulosis, large bowel wall thickening or
pericolonic fat stranding.

Vascular/Lymphatic: Normal caliber abdominal aorta. No
pathologically enlarged lymph nodes in the abdomen or pelvis.

Reproductive: Simple 1.8 cm right adnexal cyst. No left adnexal
mass. Normal anteverted uterus.

Other: No pneumoperitoneum, ascites or focal fluid collection.

Musculoskeletal: No aggressive appearing focal osseous lesions.
IMPRESSION: 1. Tiny nonobstructing stones throughout both kidneys. No
hydronephrosis. No ureteral or bladder stones.
2. Simple 1.8 cm right adnexal cyst, for which no follow-up is
recommended. This recommendation follows ACR consensus guidelines:
White Paper of the ACR Incidental Findings Committee II on Adnexal
Findings. [HOSPITAL] [DATE].

## 2018-03-28 DIAGNOSIS — J301 Allergic rhinitis due to pollen: Secondary | ICD-10-CM | POA: Diagnosis not present

## 2018-03-28 DIAGNOSIS — J3089 Other allergic rhinitis: Secondary | ICD-10-CM | POA: Diagnosis not present

## 2018-03-28 DIAGNOSIS — J4530 Mild persistent asthma, uncomplicated: Secondary | ICD-10-CM | POA: Diagnosis not present

## 2018-03-28 DIAGNOSIS — H1045 Other chronic allergic conjunctivitis: Secondary | ICD-10-CM | POA: Diagnosis not present

## 2018-04-06 MED FILL — LEVOTHYROXINE 112 MCG TAB: 112 | 30 days supply | Qty: 30 | Fill #0

## 2018-04-09 MED FILL — FLUCONAZOLE 150 MG TABS: 150 | 7 days supply | Qty: 3 | Fill #0

## 2018-04-20 DIAGNOSIS — R609 Edema, unspecified: Secondary | ICD-10-CM | POA: Diagnosis not present

## 2018-04-20 DIAGNOSIS — K219 Gastro-esophageal reflux disease without esophagitis: Secondary | ICD-10-CM | POA: Diagnosis not present

## 2018-04-20 DIAGNOSIS — F419 Anxiety disorder, unspecified: Secondary | ICD-10-CM | POA: Diagnosis not present

## 2018-04-20 DIAGNOSIS — E7800 Pure hypercholesterolemia, unspecified: Secondary | ICD-10-CM | POA: Diagnosis not present

## 2018-04-20 DIAGNOSIS — N3010 Interstitial cystitis (chronic) without hematuria: Secondary | ICD-10-CM | POA: Diagnosis not present

## 2018-04-20 DIAGNOSIS — J4530 Mild persistent asthma, uncomplicated: Secondary | ICD-10-CM | POA: Diagnosis not present

## 2018-04-20 DIAGNOSIS — Z20828 Contact with and (suspected) exposure to other viral communicable diseases: Secondary | ICD-10-CM | POA: Diagnosis not present

## 2018-04-20 DIAGNOSIS — E039 Hypothyroidism, unspecified: Secondary | ICD-10-CM | POA: Diagnosis not present

## 2018-04-20 DIAGNOSIS — G43909 Migraine, unspecified, not intractable, without status migrainosus: Secondary | ICD-10-CM | POA: Diagnosis not present

## 2018-04-20 MED FILL — HYDROCHLOROTHIAZIDE 12.5 MG: 12.5 | 90 days supply | Qty: 90 | Fill #0

## 2018-04-20 MED FILL — PROMETHAZINE 25 MG TABLET: 25 | 8 days supply | Qty: 30 | Fill #0

## 2018-04-20 MED FILL — OSELTAMIVIR PHOSPHATE 75 MG: 75 | 10 days supply | Qty: 10 | Fill #0

## 2018-04-20 MED FILL — PANTOPRAZOLE SOD DR 40 MG T: 40 | 90 days supply | Qty: 90 | Fill #0

## 2018-04-20 MED FILL — ESCITALOPRAM 5 MG TABLET: 5 | 90 days supply | Qty: 90 | Fill #0

## 2018-04-23 MED FILL — HYDROCODON-APAP 7.5-325: 7.5-325 | 30 days supply | Qty: 90 | Fill #0

## 2018-04-26 MED FILL — hydrOXYzine HCL 25 MG TABS: 25 | 30 days supply | Qty: 90 | Fill #2

## 2018-05-08 MED FILL — ELETRIPTAN HYDROBROMIDE 40: 40 | 30 days supply | Qty: 9 | Fill #0

## 2018-05-15 DIAGNOSIS — J4530 Mild persistent asthma, uncomplicated: Secondary | ICD-10-CM | POA: Diagnosis not present

## 2018-05-15 DIAGNOSIS — J301 Allergic rhinitis due to pollen: Secondary | ICD-10-CM | POA: Diagnosis not present

## 2018-05-15 DIAGNOSIS — H1045 Other chronic allergic conjunctivitis: Secondary | ICD-10-CM | POA: Diagnosis not present

## 2018-05-15 DIAGNOSIS — J3089 Other allergic rhinitis: Secondary | ICD-10-CM | POA: Diagnosis not present

## 2018-05-15 MED FILL — predniSONE 10 MG TABS: 10 | 6 days supply | Qty: 21 | Fill #0

## 2018-05-15 MED FILL — AZITHROMYCIN 250 MG TABLET: 250 | 5 days supply | Qty: 6 | Fill #0

## 2018-05-15 MED FILL — LEVOTHYROXINE 112 MCG TAB: 112 | 90 days supply | Qty: 90 | Fill #0

## 2018-05-23 MED FILL — HYDROCODON-APAP 7.5-325: 7.5-325 | 30 days supply | Qty: 90 | Fill #0

## 2018-05-31 DIAGNOSIS — R635 Abnormal weight gain: Secondary | ICD-10-CM | POA: Diagnosis not present

## 2018-05-31 DIAGNOSIS — Z01419 Encounter for gynecological examination (general) (routine) without abnormal findings: Secondary | ICD-10-CM | POA: Diagnosis not present

## 2018-05-31 DIAGNOSIS — Z3A29 29 weeks gestation of pregnancy: Secondary | ICD-10-CM | POA: Diagnosis not present

## 2018-05-31 MED FILL — busPIRone HCL 5 MG TABS: 5 | 30 days supply | Qty: 60 | Fill #0

## 2018-05-31 MED FILL — valACYclovir HCL 1 GM TABS: 1 | 30 days supply | Qty: 30 | Fill #0

## 2018-06-04 MED FILL — ONDANSETRON ODT 8 MG TABLET: 8 | 10 days supply | Qty: 30 | Fill #0

## 2018-06-05 MED FILL — MAGIC MW LID/MAAL/DP1:1:1: 2 | 2 days supply | Qty: 120 | Fill #0

## 2018-06-14 DIAGNOSIS — N3010 Interstitial cystitis (chronic) without hematuria: Secondary | ICD-10-CM | POA: Diagnosis not present

## 2018-06-14 DIAGNOSIS — R102 Pelvic and perineal pain: Secondary | ICD-10-CM | POA: Diagnosis not present

## 2018-06-14 MED FILL — TOPIRAMATE 100 MG TABLET: 100 | 90 days supply | Qty: 90 | Fill #0

## 2018-06-14 MED FILL — SYMBICORT 160-4.5 MCG INH: 160-4.5 | 30 days supply | Qty: 10 | Fill #0

## 2018-06-14 MED FILL — MONTELUKAST SOD 10 MG TAB: 10 | 90 days supply | Qty: 90 | Fill #3

## 2018-06-15 MED FILL — BUPIVACAINE 0.5% VIAL: 0.5 | 30 days supply | Qty: 450 | Fill #0

## 2018-06-15 MED FILL — AMITRIPTYLINE HCL 25 MG TAB: 25 | 30 days supply | Qty: 30 | Fill #0

## 2018-06-15 MED FILL — NITROFURANTOIN MCR 50 MG CA: 50 | 30 days supply | Qty: 30 | Fill #0

## 2018-06-15 MED FILL — DIAZEPAM 10 MG TABS: 10 | 30 days supply | Qty: 30 | Fill #0

## 2018-07-23 MED FILL — HYDROCODON-APAP 7.5-325: 7.5-325 | 30 days supply | Qty: 90 | Fill #0

## 2018-07-23 MED FILL — hydrOXYzine HCL 25 MG TABS: 25 | 30 days supply | Qty: 90 | Fill #3 | Status: TO

## 2018-07-31 DIAGNOSIS — Z1231 Encounter for screening mammogram for malignant neoplasm of breast: Secondary | ICD-10-CM | POA: Diagnosis not present

## 2018-07-31 DIAGNOSIS — Z803 Family history of malignant neoplasm of breast: Secondary | ICD-10-CM | POA: Diagnosis not present

## 2018-08-14 DIAGNOSIS — Z87442 Personal history of urinary calculi: Secondary | ICD-10-CM | POA: Diagnosis not present

## 2018-08-14 DIAGNOSIS — N200 Calculus of kidney: Secondary | ICD-10-CM | POA: Diagnosis not present

## 2018-08-14 DIAGNOSIS — N3010 Interstitial cystitis (chronic) without hematuria: Secondary | ICD-10-CM | POA: Diagnosis not present

## 2018-08-14 DIAGNOSIS — M797 Fibromyalgia: Secondary | ICD-10-CM | POA: Diagnosis not present

## 2018-08-14 DIAGNOSIS — R102 Pelvic and perineal pain: Secondary | ICD-10-CM | POA: Diagnosis not present

## 2018-08-14 MED FILL — ONDANSETRON HCL 8 MG TABLET: 8 | 10 days supply | Qty: 30 | Fill #0 | Status: TO

## 2018-08-21 MED FILL — SYMBICORT 160-4.5 MCG INH: 160-4.5 | 30 days supply | Qty: 10 | Fill #1 | Status: TO

## 2018-08-21 MED FILL — HYDROCODON-APAP 7.5-325: 7.5-325 | 30 days supply | Qty: 90 | Fill #0

## 2018-08-21 MED FILL — LEVOTHYROXINE 112 MCG TAB: 112 | 90 days supply | Qty: 90 | Fill #1

## 2018-09-04 MED FILL — hydrOXYzine HCL 25 MG TABS: 25 | 30 days supply | Qty: 90 | Fill #0

## 2018-09-04 MED FILL — DIAZEPAM 10 MG TABS: 10 | 30 days supply | Qty: 30 | Fill #0

## 2018-09-04 MED FILL — TOPIRAMATE 100 MG TABLET: 100 | 90 days supply | Qty: 90 | Fill #0

## 2018-09-04 MED FILL — ALBUTEROL SULFATE HFA 108 (: 108 (90 BAS | 17 days supply | Qty: 18 | Fill #0

## 2018-09-04 MED FILL — busPIRone HCL 5 MG TABS: 5 | 30 days supply | Qty: 60 | Fill #0

## 2018-09-05 IMAGING — US US RENAL
1 series · 14 of 25 positions shown · non-contrast
Comparison: Prior CT from 06/28/2017.

CLINICAL DATA: Initial evaluation for acute severe right flank
pain. Recent renal stent removal.

EXAM:
RENAL / URINARY TRACT ULTRASOUND COMPLETE

[Series 1: us renal · 0.24mm/px · 14 of 34 slices shown]
[im 1/34]
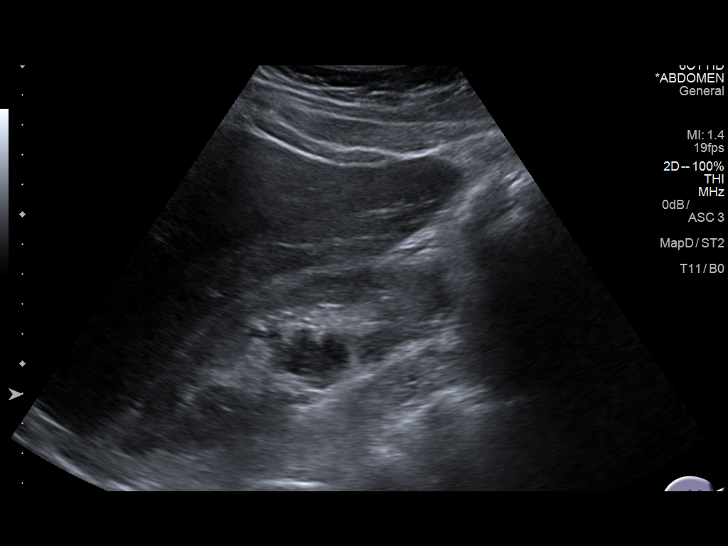
[im 3/34]
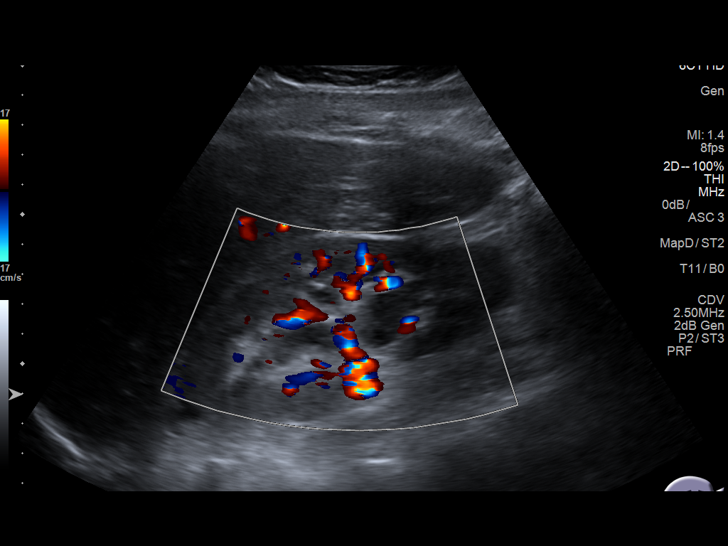
[im 6/34]
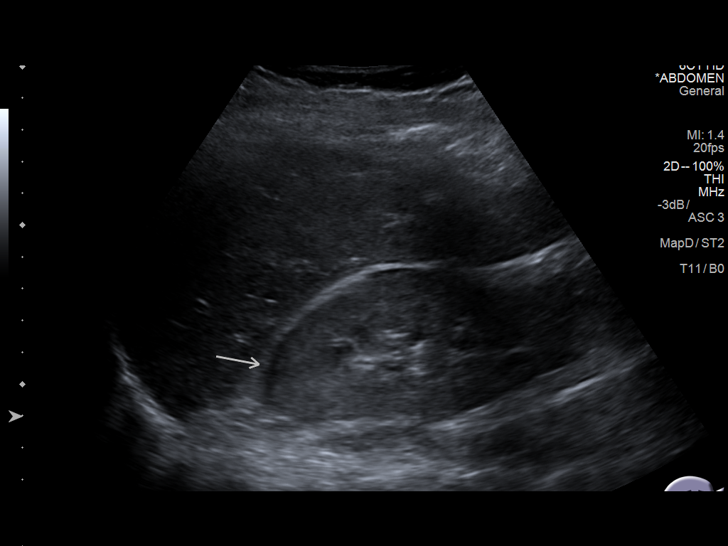
[im 9/34]
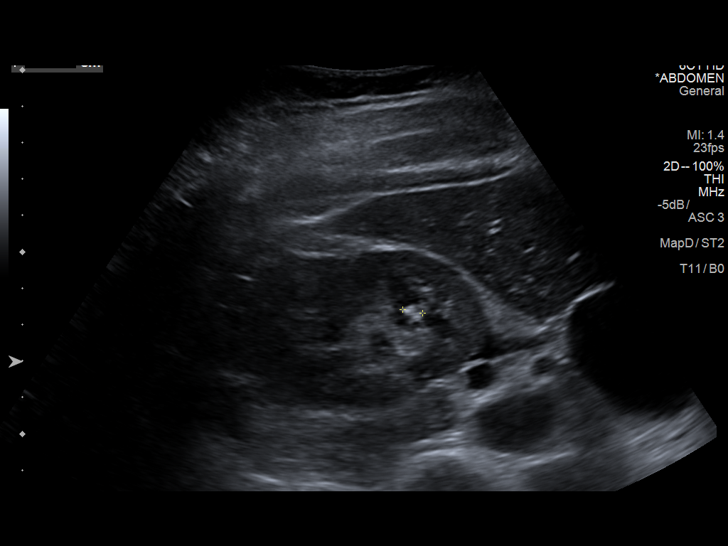
[im 12/34]
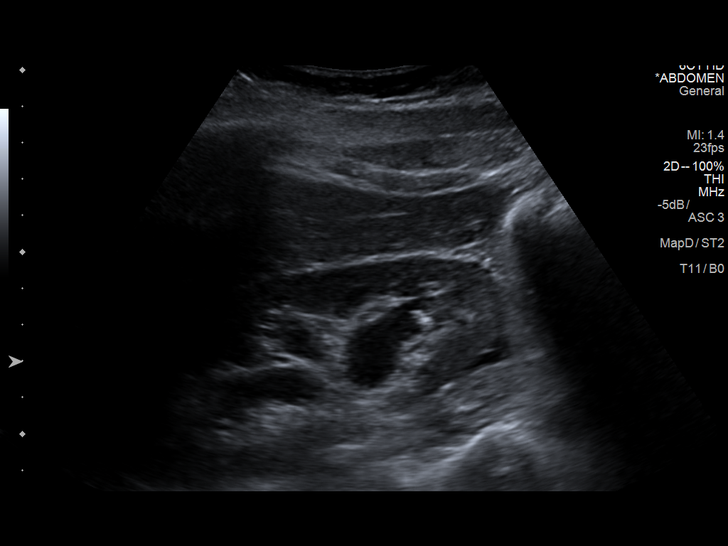
[im 13/34]
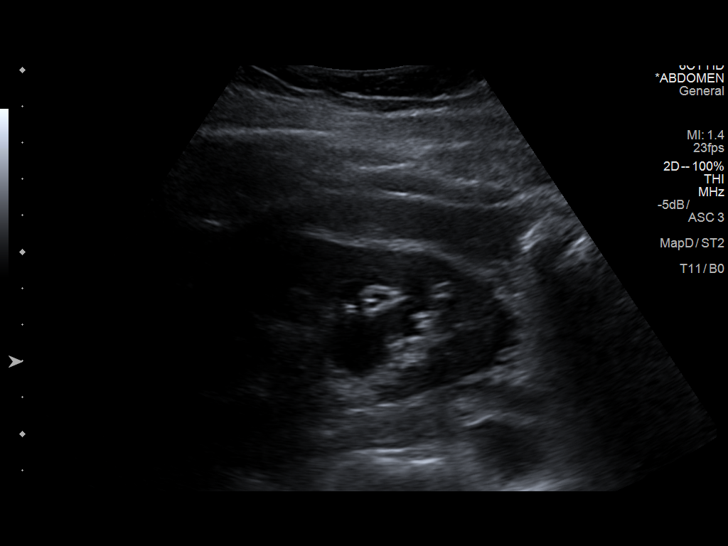
[im 16/34]
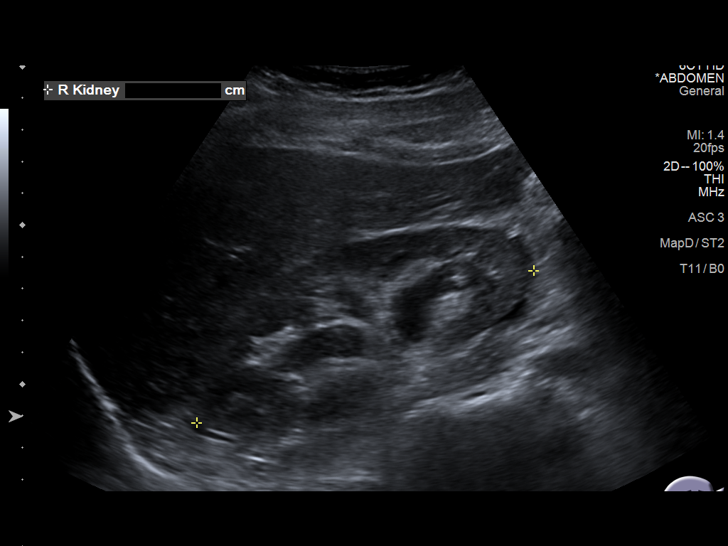
[im 18/34]
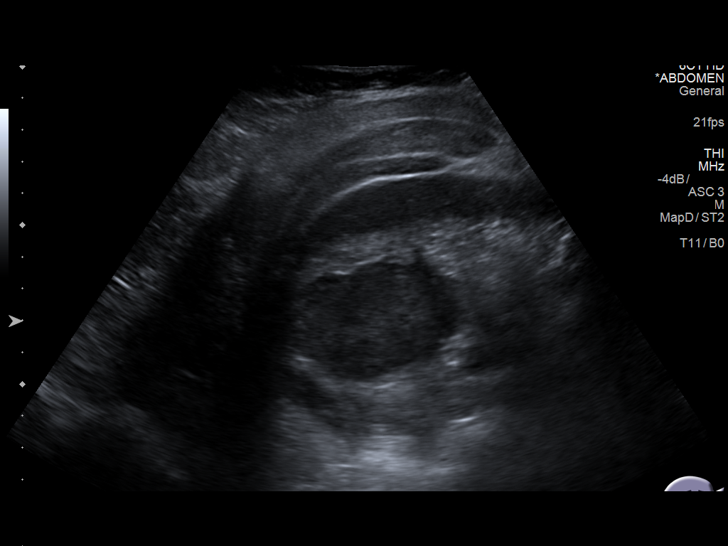
[im 21/34]
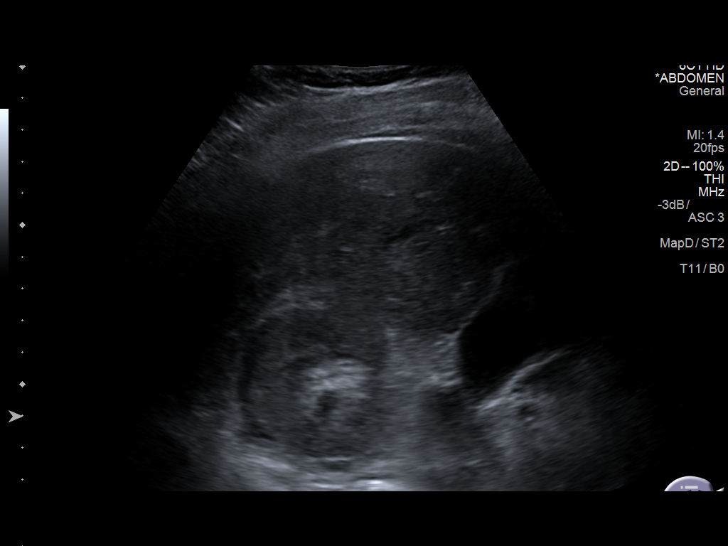
[im 23/34]
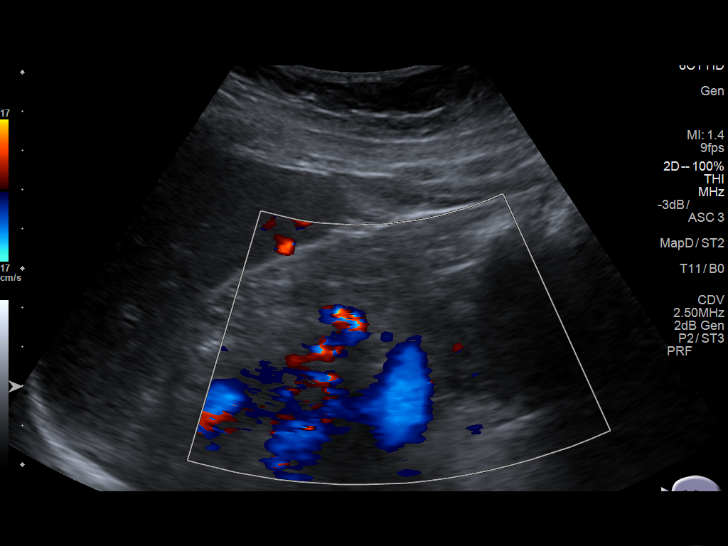
[im 25/34]
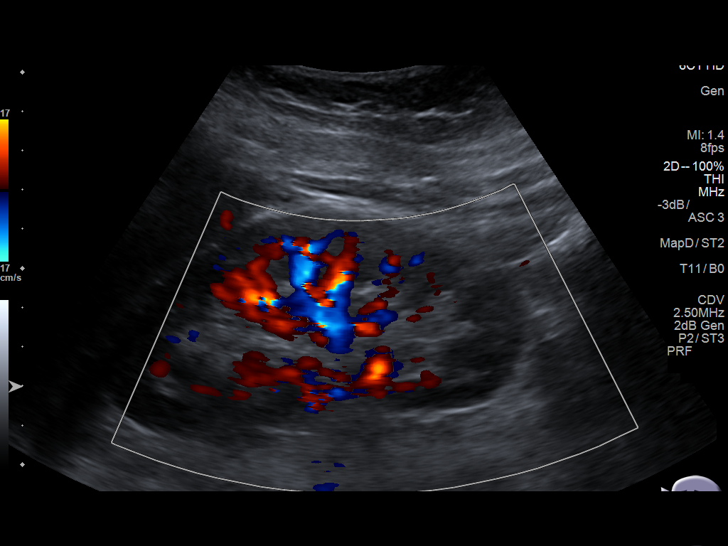
[im 28/34]
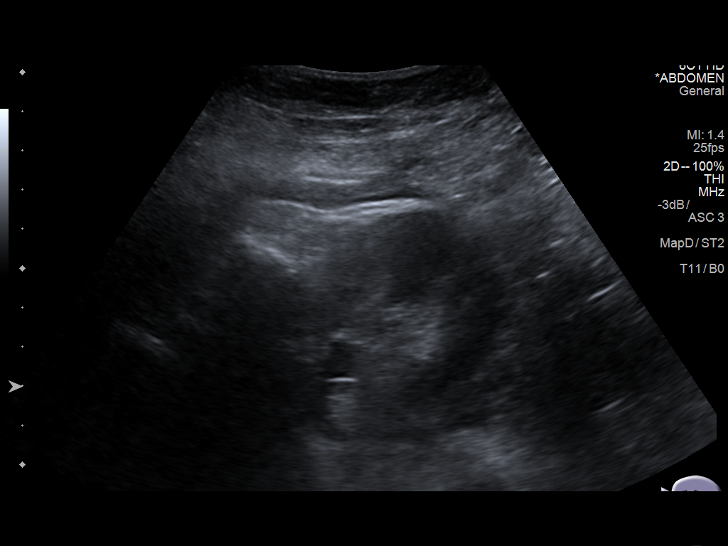
[im 31/34]
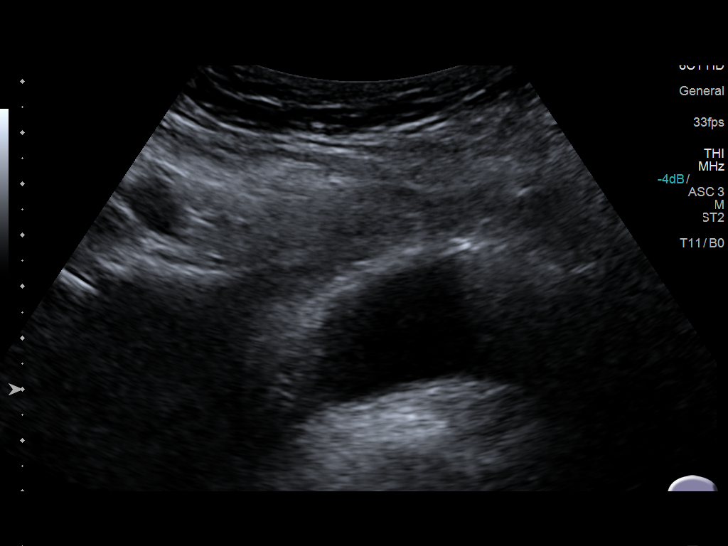
[im 34/34]
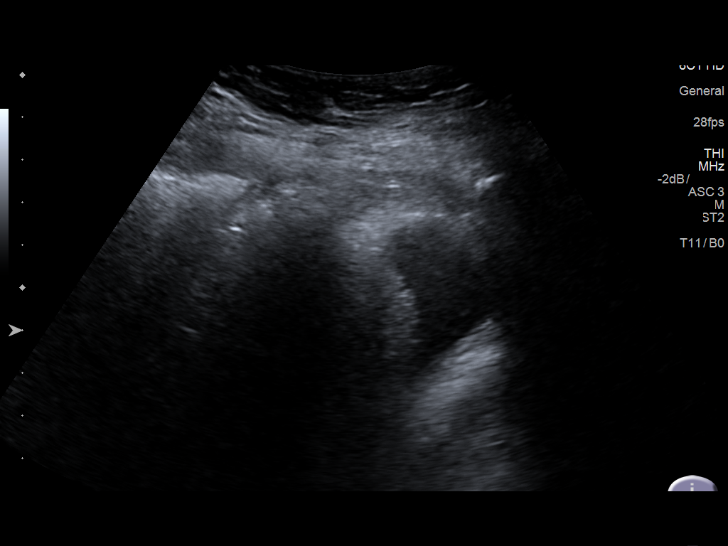

[14 of 25 positions shown; findings below may reference images not displayed]

FINDINGS: Right Kidney:

Length: 11.6 cm. Echogenicity within normal limits. Shadowing 6 mm
calculus present within the lower pole. Mild right hydronephrosis.
Mild dilatation of the proximal right ureter. Trace perinephric
fluid noted.

Left Kidney:

Length: 10.6 cm. Echogenicity within normal limits. No mass or
hydronephrosis visualized.

Bladder:

Appears normal for degree of bladder distention.
IMPRESSION: 1. Mild right hydronephrosis with proximal right hydroureter.
Clinical correlation for possible obstructive ureterolithiasis
recommended.
2. 6 mm nonobstructive right renal nephrolithiasis.
3. Normal sonographic evaluation of the left kidney.

## 2018-09-05 MED FILL — MONTELUKAST SOD 10 MG TAB: 10 | 90 days supply | Qty: 90 | Fill #0

## 2018-09-18 MED FILL — HYDROCODON-APAP 7.5-325: 7.5-325 | 30 days supply | Qty: 90 | Fill #0

## 2018-09-27 MED FILL — ONDANSETRON HCL 8 MG TABLET: 8 | 10 days supply | Qty: 30 | Fill #0

## 2018-10-03 DIAGNOSIS — E7800 Pure hypercholesterolemia, unspecified: Secondary | ICD-10-CM | POA: Diagnosis not present

## 2018-10-05 DIAGNOSIS — N3010 Interstitial cystitis (chronic) without hematuria: Secondary | ICD-10-CM | POA: Diagnosis not present

## 2018-10-05 DIAGNOSIS — R600 Localized edema: Secondary | ICD-10-CM | POA: Diagnosis not present

## 2018-10-05 DIAGNOSIS — K219 Gastro-esophageal reflux disease without esophagitis: Secondary | ICD-10-CM | POA: Diagnosis not present

## 2018-10-05 DIAGNOSIS — F419 Anxiety disorder, unspecified: Secondary | ICD-10-CM | POA: Diagnosis not present

## 2018-10-05 DIAGNOSIS — E7800 Pure hypercholesterolemia, unspecified: Secondary | ICD-10-CM | POA: Diagnosis not present

## 2018-10-05 DIAGNOSIS — E039 Hypothyroidism, unspecified: Secondary | ICD-10-CM | POA: Diagnosis not present

## 2018-10-05 DIAGNOSIS — Z0000 Encounter for general adult medical examination without abnormal findings: Secondary | ICD-10-CM | POA: Diagnosis not present

## 2018-10-05 DIAGNOSIS — J4520 Mild intermittent asthma, uncomplicated: Secondary | ICD-10-CM | POA: Diagnosis not present

## 2018-10-05 DIAGNOSIS — G43909 Migraine, unspecified, not intractable, without status migrainosus: Secondary | ICD-10-CM | POA: Diagnosis not present

## 2018-10-17 MED FILL — TOPIRAMATE 50 MG TABLET: 50 | 90 days supply | Qty: 270 | Fill #0

## 2018-10-17 MED FILL — HYDROCODON-APAP 7.5-325: 7.5-325 | 30 days supply | Qty: 90 | Fill #0

## 2018-10-17 MED FILL — LEVOTHYROXINE 125 MCG TAB: 125 | 90 days supply | Qty: 90 | Fill #0

## 2018-10-18 MED FILL — SYMBICORT 160-4.5 MCG INH: 160-4.5 | 30 days supply | Qty: 10 | Fill #0

## 2018-11-15 MED FILL — ONDANSETRON HCL 8 MG TABLET: 8 | 10 days supply | Qty: 30 | Fill #1

## 2018-11-15 MED FILL — HYDROCODON-APAP 7.5-325: 7.5-325 | 30 days supply | Qty: 90 | Fill #0

## 2018-11-15 MED FILL — DIAZEPAM 10 MG TABS: 10 | 30 days supply | Qty: 30 | Fill #1

## 2018-12-12 MED FILL — HYDROCODON-APAP 7.5-325: 7.5-325 | 30 days supply | Qty: 90 | Fill #0

## 2018-12-12 MED FILL — hydrOXYzine HCL 25 MG TABS: 25 | 30 days supply | Qty: 90 | Fill #0

## 2018-12-13 MED FILL — SYMBICORT 160-4.5 MCG INH: 160-4.5 | 30 days supply | Qty: 10 | Fill #1

## 2018-12-26 DIAGNOSIS — R399 Unspecified symptoms and signs involving the genitourinary system: Secondary | ICD-10-CM | POA: Diagnosis not present

## 2019-01-04 MED FILL — MONTELUKAST SOD 10 MG TAB: 10 | 90 days supply | Qty: 90 | Fill #1

## 2019-01-04 MED FILL — HYDROCHLOROTHIAZIDE 12.5 MG: 12.5 | 90 days supply | Qty: 90 | Fill #0

## 2019-01-08 DIAGNOSIS — E039 Hypothyroidism, unspecified: Secondary | ICD-10-CM | POA: Diagnosis not present

## 2019-01-14 MED FILL — HYDROCODON-APAP 7.5-325: 7.5-325 | 30 days supply | Qty: 90 | Fill #0

## 2019-01-14 MED FILL — ONDANSETRON HCL 8 MG TABLET: 8 | 10 days supply | Qty: 30 | Fill #2

## 2019-01-15 DIAGNOSIS — N3010 Interstitial cystitis (chronic) without hematuria: Secondary | ICD-10-CM | POA: Diagnosis not present

## 2019-01-15 DIAGNOSIS — N200 Calculus of kidney: Secondary | ICD-10-CM | POA: Diagnosis not present

## 2019-01-15 DIAGNOSIS — N9489 Other specified conditions associated with female genital organs and menstrual cycle: Secondary | ICD-10-CM | POA: Diagnosis not present

## 2019-01-19 MED FILL — OXYBUTYNIN CL ER 10 MG TAB: 10 | 30 days supply | Qty: 30 | Fill #0

## 2019-01-19 MED FILL — DIAZEPAM 10 MG TABS: 10 | 30 days supply | Qty: 30 | Fill #0

## 2019-02-01 MED FILL — LEVOTHYROXINE 125 MCG TAB: 125 | 90 days supply | Qty: 90 | Fill #1

## 2019-02-07 MED FILL — SYMBICORT 160-4.5 MCG INH: 160-4.5 | 30 days supply | Qty: 10 | Fill #2

## 2019-02-07 MED FILL — ONDANSETRON HCL 8 MG TABLET: 8 | 10 days supply | Qty: 30 | Fill #3

## 2019-02-14 MED FILL — HYDROCODON-APAP 7.5-325: 7.5-325 | 30 days supply | Qty: 90 | Fill #0

## 2019-02-27 MED FILL — hydrOXYzine HCL 25 MG TABS: 25 | 30 days supply | Qty: 90 | Fill #1

## 2019-03-15 MED FILL — ONDANSETRON HCL 8 MG TABLET: 8 | 10 days supply | Qty: 30 | Fill #4

## 2019-03-15 MED FILL — HYDROCODON-APAP 7.5-325: 7.5-325 | 30 days supply | Qty: 90 | Fill #0

## 2019-03-15 MED FILL — DIAZEPAM 10 MG TABS: 10 | 30 days supply | Qty: 30 | Fill #1

## 2019-03-27 DIAGNOSIS — H1045 Other chronic allergic conjunctivitis: Secondary | ICD-10-CM | POA: Diagnosis not present

## 2019-03-27 DIAGNOSIS — J3089 Other allergic rhinitis: Secondary | ICD-10-CM | POA: Diagnosis not present

## 2019-03-27 DIAGNOSIS — J301 Allergic rhinitis due to pollen: Secondary | ICD-10-CM | POA: Diagnosis not present

## 2019-03-27 DIAGNOSIS — J4530 Mild persistent asthma, uncomplicated: Secondary | ICD-10-CM | POA: Diagnosis not present

## 2019-03-27 MED FILL — FLUTICASONE PROP 50 MCG SPR: 50 | 30 days supply | Qty: 16 | Fill #0

## 2019-03-27 MED FILL — AZELASTINE HCL 137 MCG SPRY: 0.1 | 25 days supply | Qty: 30 | Fill #0

## 2019-04-09 MED FILL — AZELASTINE HCL 137 MCG SPRY: 0.1 | 25 days supply | Qty: 30 | Fill #0

## 2019-04-09 MED FILL — FLUTICASONE PROP 50 MCG SPR: 50 | 30 days supply | Qty: 16 | Fill #0

## 2019-04-15 MED FILL — TOPIRAMATE 50 MG TABLET: 50 | 90 days supply | Qty: 270 | Fill #1

## 2019-04-15 MED FILL — HYDROCODON-APAP 7.5-325: 7.5-325 | 30 days supply | Qty: 90 | Fill #0

## 2019-04-15 MED FILL — MONTELUKAST SOD 10 MG TAB: 10 | 90 days supply | Qty: 90 | Fill #2

## 2019-04-18 DIAGNOSIS — N3010 Interstitial cystitis (chronic) without hematuria: Secondary | ICD-10-CM | POA: Diagnosis not present

## 2019-04-18 MED FILL — TAMSULOSIN HCL 0.4 MG CAP: 0.4 | 30 days supply | Qty: 30 | Fill #0

## 2019-05-06 MED FILL — HYDROCODON-APAP 7.5-325: 7.5-325 | 30 days supply | Qty: 120 | Fill #0

## 2019-05-06 MED FILL — HYDROCHLOROTHIAZIDE 12.5 MG: 12.5 | 90 days supply | Qty: 90 | Fill #0

## 2019-05-15 MED FILL — hydrOXYzine HCL 25 MG TABS: 25 | 30 days supply | Qty: 90 | Fill #2

## 2019-05-15 MED FILL — DIAZEPAM 10 MG TABS: 10 | 30 days supply | Qty: 30 | Fill #2

## 2019-05-15 MED FILL — ONDANSETRON HCL 8 MG TABLET: 8 | 10 days supply | Qty: 30 | Fill #0

## 2019-05-21 MED FILL — LEVOTHYROXINE 125 MCG TAB: 125 | 90 days supply | Qty: 90 | Fill #0

## 2019-05-21 MED FILL — SYMBICORT 160-4.5 MCG INH: 160-4.5 | 30 days supply | Qty: 10 | Fill #0

## 2019-06-05 MED FILL — HYDROCODON-APAP 7.5-325: 7.5-325 | 30 days supply | Qty: 120 | Fill #0

## 2019-06-13 MED FILL — busPIRone HCL 5 MG TABS: 5 | 30 days supply | Qty: 60 | Fill #0

## 2019-06-21 MED FILL — ONDANSETRON HCL 8 MG TABLET: 8 | 10 days supply | Qty: 30 | Fill #1

## 2019-07-04 MED FILL — HYDROCODON-APAP 7.5-325: 7.5-325 | 30 days supply | Qty: 120 | Fill #0

## 2019-07-04 MED FILL — ALBUTEROL SULFATE HFA 108 (: 108 (90 BAS | 17 days supply | Qty: 18 | Fill #0

## 2019-07-04 MED FILL — DIAZEPAM 10 MG TABS: 10 | 30 days supply | Qty: 30 | Fill #3

## 2019-07-04 MED FILL — SYMBICORT 160-4.5 MCG INH: 160-4.5 | 30 days supply | Qty: 10 | Fill #1

## 2019-07-04 MED FILL — hydrOXYzine HCL 25 MG TABS: 25 | 30 days supply | Qty: 90 | Fill #3

## 2019-07-08 ENCOUNTER — Other Ambulatory Visit (HOSPITAL_COMMUNITY): Payer: Self-pay | Admitting: Obstetrics and Gynecology

## 2019-07-08 DIAGNOSIS — R635 Abnormal weight gain: Secondary | ICD-10-CM | POA: Diagnosis not present

## 2019-07-08 DIAGNOSIS — Z01419 Encounter for gynecological examination (general) (routine) without abnormal findings: Secondary | ICD-10-CM | POA: Diagnosis not present

## 2019-07-08 DIAGNOSIS — Z6830 Body mass index (BMI) 30.0-30.9, adult: Secondary | ICD-10-CM | POA: Diagnosis not present

## 2019-07-08 DIAGNOSIS — B009 Herpesviral infection, unspecified: Secondary | ICD-10-CM | POA: Diagnosis not present

## 2019-07-08 DIAGNOSIS — R350 Frequency of micturition: Secondary | ICD-10-CM | POA: Diagnosis not present

## 2019-07-08 DIAGNOSIS — Z1231 Encounter for screening mammogram for malignant neoplasm of breast: Secondary | ICD-10-CM | POA: Diagnosis not present

## 2019-07-08 DIAGNOSIS — F419 Anxiety disorder, unspecified: Secondary | ICD-10-CM | POA: Diagnosis not present

## 2019-07-08 DIAGNOSIS — R300 Dysuria: Secondary | ICD-10-CM | POA: Diagnosis not present

## 2019-07-08 MED FILL — busPIRone HCL 5 MG TABS: 5 | 30 days supply | Qty: 60 | Fill #0

## 2019-07-08 MED FILL — PHENTERMINE 37.5 MG TABLET: 37.5 | 30 days supply | Qty: 30 | Fill #0

## 2019-07-08 MED FILL — valACYclovir HCL 1 GM TABS: 1 | 30 days supply | Qty: 30 | Fill #0

## 2019-07-19 MED FILL — MONTELUKAST SOD 10 MG TAB: 10 | 90 days supply | Qty: 90 | Fill #3

## 2019-07-30 DIAGNOSIS — N3010 Interstitial cystitis (chronic) without hematuria: Secondary | ICD-10-CM | POA: Diagnosis not present

## 2019-07-30 DIAGNOSIS — R102 Pelvic and perineal pain: Secondary | ICD-10-CM | POA: Diagnosis not present

## 2019-08-01 MED FILL — HYDROCODON-APAP 7.5-325: 7.5-325 | 30 days supply | Qty: 120 | Fill #0

## 2019-08-08 MED FILL — DIAZEPAM 10 MG TABS: 10 | 30 days supply | Qty: 30 | Fill #0

## 2019-08-16 MED FILL — HYDROCHLOROTHIAZIDE 12.5 MG: 12.5 | 90 days supply | Qty: 90 | Fill #1

## 2019-08-16 MED FILL — ONDANSETRON HCL 8 MG TABLET: 8 | 10 days supply | Qty: 30 | Fill #2

## 2019-08-16 MED FILL — PHENTERMINE 37.5 MG TABLET: 37.5 | 30 days supply | Qty: 30 | Fill #1

## 2019-08-23 DIAGNOSIS — J019 Acute sinusitis, unspecified: Secondary | ICD-10-CM | POA: Diagnosis not present

## 2019-08-23 DIAGNOSIS — J4521 Mild intermittent asthma with (acute) exacerbation: Secondary | ICD-10-CM | POA: Diagnosis not present

## 2019-08-23 MED FILL — AMOXICILLIN 875 MG TABS: 875 | 10 days supply | Qty: 20 | Fill #0

## 2019-08-23 MED FILL — SYMBICORT 160-4.5 MCG INH: 160-4.5 | 30 days supply | Qty: 10 | Fill #2

## 2019-08-29 MED FILL — HYDROCODON-APAP 7.5-325: 7.5-325 | 30 days supply | Qty: 120 | Fill #0

## 2019-09-06 MED FILL — LEVOTHYROXINE 125 MCG TABLE: 125 | 90 days supply | Qty: 90 | Fill #1

## 2019-09-17 MED FILL — DIAZEPAM 10 MG TABS: 10 | 30 days supply | Qty: 30 | Fill #1

## 2019-09-19 ENCOUNTER — Telehealth: Payer: 59 | Admitting: Physician Assistant

## 2019-09-19 DIAGNOSIS — T63464A Toxic effect of venom of wasps, undetermined, initial encounter: Secondary | ICD-10-CM | POA: Diagnosis not present

## 2019-09-19 MED ORDER — CEPHALEXIN 500 MG PO CAPS: 500.0000 mg | ORAL_CAPSULE | Freq: Four times a day (QID) | ORAL | 0 refills | Status: AC

## 2019-09-19 MED ORDER — HYDROCORTISONE 2.5 % EX CREA: TOPICAL_CREAM | Freq: Two times a day (BID) | CUTANEOUS | 0 refills | Status: DC

## 2019-09-19 MED FILL — HYDROCORTISONE 2.5% CREAM: 2.5 | 7 days supply | Qty: 30 | Fill #0

## 2019-09-19 MED FILL — CEPHALEXIN 500 MG CAPSULE: 500 | 5 days supply | Qty: 20 | Fill #0

## 2019-09-19 NOTE — Progress Notes (Signed)
E Visit for Insect Sting  Thank you for describing the insect sting for [place].  Here is how we plan to help!  Based on the information you have shared with me it looks like you have: a possible skin infection related to the sting.  The 2 greatest risks from insect stings are allergic reaction, which can be fatal in some people and infection, which is more common and less serious.  Bees, wasps, yellow jackets, and hornets belong to a class of insects called Hymenoptera.  Most insect stings cause only minor discomfort.  Stings can happen anywhere on the body and can be painful.  Most stings are from honey bees or yellow jackets.  Fire ants can sting multiple times.  The sites of the stings are more likely to become infected.    Based on your information I have: prescribed a stronger steroid cream to help with the itching and inflammation. I have also prescribed an oral antibiotic, Keflex, that will treat a skin infection that has developed since being stung by the insect.   You can continue to use Calamine lotion and Benadryl for the itching. I would also recommend continuation of Naproxen to help with the discomfort.   What can be used to prevent Insect Stings?   Insect repellant with at least 20% DEET.    Wearing long pants and shirts with socks and shoes.    Wear dark or drab-colored clothes rather than bright colors.    Avoid using perfumes and hair sprays; these attract insects.  HOME CARE ADVICE:  1. Stinger removal:  The stinger looks like a tiny black dot in the sting.  Use a fingernail, credit card edge, or knife-edge to scrape it off.  Don't pull it out because it squeezes out more venom.  If the stinger is below the skin surface, leave it alone.  It will be shed with normal skin healing. 2. Use cold compresses to the area of the sting for 10-20 minutes.  You may repeat this as needed to relieve symptoms of pain and swelling. 3.  For pain relief, take acetominophen 650  mg 4-6 hours as needed or ibuprofen 400 mg every 6-8 hours as needed or naproxen 250-500 mg every 12 hours as needed. 4.  You can also use hydrocortisone cream 0.5% or 1% up to 4 times daily as needed for itching. 5.  If the sting becomes very itchy, take Benadryl 25-50 mg, follow directions on box. 6.  Wash the area 2-3 times daily with antibacterial soap and warm water. 7. Call your Doctor if:  Fever, a severe headache, or rash occur in the next 2 weeks.  Sting area begins to look infected.  Redness and swelling worsens after home treatment.  Your current symptoms become worse.    MAKE SURE YOU:   Understand these instructions.  Will watch your condition.  Will get help right away if you are not doing well or get worse.  Thank you for choosing an e-visit. Your e-visit answers were reviewed by a board certified advanced clinical practitioner to complete your personal care plan. Depending upon the condition, your plan could have included both over the counter or prescription medications. Please review your pharmacy choice. Be sure that the pharmacy you have chosen is open so that you can pick up your prescription now.  If there is a problem you may message your provider in [place] to have the prescription routed to another pharmacy. Your safety is important to [place]. If you  have drug allergies check your prescription carefully.  For the next 24 hours, you can use MyChart to ask questions about today's visit, request a non-urgent call back, or ask for a work or school excuse from your e-visit provider. You will get an email in the next two days asking about your experience. I hope that your e-visit has been valuable and will speed your recovery.  Approximately 5 minutes was spent documenting and reviewing patient's chart.

## 2019-09-24 MED FILL — FLUCONAZOLE 150 MG TABS: 150 | 7 days supply | Qty: 3 | Fill #0

## 2019-09-30 MED FILL — HYDROCODON-APAP 7.5-325: 7.5-325 | 30 days supply | Qty: 120 | Fill #0

## 2019-09-30 MED FILL — ONDANSETRON HCL 8 MG TABLET: 8 | 10 days supply | Qty: 30 | Fill #3

## 2019-09-30 MED FILL — hydrOXYzine HCL 25 MG TABS: 25 | 30 days supply | Qty: 90 | Fill #4

## 2019-10-07 ENCOUNTER — Other Ambulatory Visit (HOSPITAL_COMMUNITY): Payer: Self-pay | Admitting: Family Medicine

## 2019-10-07 DIAGNOSIS — F419 Anxiety disorder, unspecified: Secondary | ICD-10-CM | POA: Diagnosis not present

## 2019-10-07 DIAGNOSIS — Z0000 Encounter for general adult medical examination without abnormal findings: Secondary | ICD-10-CM | POA: Diagnosis not present

## 2019-10-07 DIAGNOSIS — E7800 Pure hypercholesterolemia, unspecified: Secondary | ICD-10-CM | POA: Diagnosis not present

## 2019-10-07 DIAGNOSIS — D229 Melanocytic nevi, unspecified: Secondary | ICD-10-CM | POA: Diagnosis not present

## 2019-10-07 DIAGNOSIS — J4520 Mild intermittent asthma, uncomplicated: Secondary | ICD-10-CM | POA: Diagnosis not present

## 2019-10-07 DIAGNOSIS — K219 Gastro-esophageal reflux disease without esophagitis: Secondary | ICD-10-CM | POA: Diagnosis not present

## 2019-10-07 DIAGNOSIS — N3010 Interstitial cystitis (chronic) without hematuria: Secondary | ICD-10-CM | POA: Diagnosis not present

## 2019-10-07 DIAGNOSIS — E039 Hypothyroidism, unspecified: Secondary | ICD-10-CM | POA: Diagnosis not present

## 2019-10-07 DIAGNOSIS — G43909 Migraine, unspecified, not intractable, without status migrainosus: Secondary | ICD-10-CM | POA: Diagnosis not present

## 2019-10-07 MED FILL — buPROPion HCL ER (XL) 150 M: 150 | 30 days supply | Qty: 30 | Fill #0

## 2019-10-07 MED FILL — PROMETHAZINE 25 MG TABLET: 25 | 7 days supply | Qty: 30 | Fill #0

## 2019-10-07 MED FILL — ALBUTEROL SULFATE HFA 108 (: 108 (90 BAS | 17 days supply | Qty: 18 | Fill #0

## 2019-10-08 MED FILL — SYMBICORT 160-4.5 MCG INH: 160-4.5 | 30 days supply | Qty: 10 | Fill #3

## 2019-10-11 MED FILL — busPIRone HCL 5 MG TABS: 5 | 30 days supply | Qty: 60 | Fill #1

## 2019-10-15 MED FILL — DIAZEPAM 10 MG TABS: 10 | 30 days supply | Qty: 30 | Fill #2

## 2019-10-29 ENCOUNTER — Other Ambulatory Visit (HOSPITAL_COMMUNITY): Payer: Self-pay | Admitting: Urology

## 2019-10-29 DIAGNOSIS — N200 Calculus of kidney: Secondary | ICD-10-CM | POA: Diagnosis not present

## 2019-10-29 DIAGNOSIS — R102 Pelvic and perineal pain: Secondary | ICD-10-CM | POA: Diagnosis not present

## 2019-10-29 DIAGNOSIS — J4520 Mild intermittent asthma, uncomplicated: Secondary | ICD-10-CM | POA: Diagnosis not present

## 2019-10-29 DIAGNOSIS — N3010 Interstitial cystitis (chronic) without hematuria: Secondary | ICD-10-CM | POA: Diagnosis not present

## 2019-10-29 MED FILL — AMITRIPTYLINE HCL 25 MG TAB: 25 | 90 days supply | Qty: 90 | Fill #0

## 2019-10-29 MED FILL — TAMSULOSIN HCL 0.4 MG CAP: 0.4 | 90 days supply | Qty: 90 | Fill #0

## 2019-10-29 MED FILL — ONDANSETRON HCL 8 MG TABLET: 8 | 10 days supply | Qty: 30 | Fill #0

## 2019-10-29 MED FILL — hydrOXYzine HCL 25 MG TABS: 25 | 90 days supply | Qty: 270 | Fill #0

## 2019-10-29 MED FILL — HYDROCODON-APAP 7.5-325: 7.5-325 | 30 days supply | Qty: 120 | Fill #0

## 2019-11-07 ENCOUNTER — Other Ambulatory Visit (HOSPITAL_COMMUNITY): Payer: Self-pay | Admitting: Urology

## 2019-11-07 MED FILL — MONTELUKAST SOD 10 MG TAB: 10 | 90 days supply | Qty: 90 | Fill #0

## 2019-11-07 MED FILL — buPROPion HCL ER (XL) 150 M: 150 | 30 days supply | Qty: 30 | Fill #1

## 2019-11-07 MED FILL — ONDANSETRON HCL 8 MG TABLET: 8 | 10 days supply | Qty: 30 | Fill #4

## 2019-11-28 MED FILL — SYMBICORT 160-4.5 MCG INH: 160-4.5 | 30 days supply | Qty: 10 | Fill #4

## 2019-11-28 MED FILL — HYDROCODON-APAP 7.5-325: 7.5-325 | 30 days supply | Qty: 120 | Fill #0

## 2019-11-28 MED FILL — DIAZEPAM 10 MG TABS: 10 | 30 days supply | Qty: 30 | Fill #3

## 2019-12-24 MED FILL — buPROPion HCL ER (XL) 150 M: 150 | 30 days supply | Qty: 30 | Fill #2

## 2019-12-24 MED FILL — ALBUTEROL SULFATE HFA 108 (: 108 (90 BAS | 17 days supply | Qty: 18 | Fill #1

## 2019-12-24 MED FILL — busPIRone HCL 5 MG TABS: 5 | 30 days supply | Qty: 60 | Fill #2

## 2019-12-25 ENCOUNTER — Other Ambulatory Visit (HOSPITAL_COMMUNITY): Payer: Self-pay | Admitting: Family Medicine

## 2019-12-25 MED FILL — HYDROCHLOROTHIAZIDE 12.5 MG: 12.5 | 90 days supply | Qty: 90 | Fill #0

## 2019-12-25 MED FILL — LEVOTHYROXINE 125 MCG TABLE: 125 | 90 days supply | Qty: 90 | Fill #0

## 2019-12-28 MED FILL — HYDROCODON-APAP 7.5-325: 7.5-325 | 30 days supply | Qty: 120 | Fill #0

## 2020-01-07 MED FILL — DIAZEPAM 10 MG TABS: 10 | 30 days supply | Qty: 30 | Fill #4

## 2020-01-30 ENCOUNTER — Other Ambulatory Visit (HOSPITAL_COMMUNITY): Payer: Self-pay | Admitting: Urology

## 2020-01-30 DIAGNOSIS — N3010 Interstitial cystitis (chronic) without hematuria: Secondary | ICD-10-CM | POA: Diagnosis not present

## 2020-01-30 DIAGNOSIS — R102 Pelvic and perineal pain: Secondary | ICD-10-CM | POA: Diagnosis not present

## 2020-01-30 DIAGNOSIS — N200 Calculus of kidney: Secondary | ICD-10-CM | POA: Diagnosis not present

## 2020-01-30 MED FILL — OXYBUTYNIN CL ER 10 MG TAB: 10 | 30 days supply | Qty: 30 | Fill #0

## 2020-01-30 MED FILL — HYDROCHLOROTHIAZIDE 50 MG T: 50 | 30 days supply | Qty: 30 | Fill #0

## 2020-01-30 MED FILL — HYDROCODON-APAP 7.5-325: 7.5-325 | 30 days supply | Qty: 120 | Fill #0

## 2020-01-30 MED FILL — TOPIRAMATE 50 MG TABLET: 50 | 90 days supply | Qty: 270 | Fill #0

## 2020-01-30 MED FILL — POTASSIUM CITRATE ER 15 MEQ: 15 MEQ | 30 days supply | Qty: 60 | Fill #0

## 2020-01-31 MED FILL — buPROPion HCL ER (XL) 150 M: 150 | 30 days supply | Qty: 30 | Fill #3

## 2020-01-31 MED FILL — ONDANSETRON HCL 8 MG TABLET: 8 | 10 days supply | Qty: 30 | Fill #1

## 2020-02-05 ENCOUNTER — Other Ambulatory Visit: Payer: Self-pay

## 2020-02-05 ENCOUNTER — Inpatient Hospital Stay: Payer: 59 | Attending: Emergency Medicine

## 2020-02-05 DIAGNOSIS — Z23 Encounter for immunization: Secondary | ICD-10-CM | POA: Insufficient documentation

## 2020-02-11 DIAGNOSIS — H5213 Myopia, bilateral: Secondary | ICD-10-CM | POA: Diagnosis not present

## 2020-02-11 MED FILL — PREDNISOLONE AC 1% EYE DROP: 1 | 16 days supply | Qty: 5 | Fill #0

## 2020-02-13 MED FILL — SYMBICORT 160-4.5 MCG INH: 160-4.5 | 30 days supply | Qty: 10 | Fill #5

## 2020-02-13 MED FILL — ALBUTEROL SULFATE HFA 108 (: 108 (90 BAS | 17 days supply | Qty: 18 | Fill #2

## 2020-02-13 MED FILL — MONTELUKAST SOD 10 MG TAB: 10 | 90 days supply | Qty: 90 | Fill #1

## 2020-02-25 DIAGNOSIS — J4521 Mild intermittent asthma with (acute) exacerbation: Secondary | ICD-10-CM | POA: Diagnosis not present

## 2020-02-25 MED FILL — HYDROCODONE-CHLORPHEN ER SU: 10-8 | 5 days supply | Qty: 50 | Fill #0

## 2020-02-25 MED FILL — predniSONE 10 MG TABS: 10 | 8 days supply | Qty: 20 | Fill #0

## 2020-02-25 MED FILL — AZITHROMYCIN 250 MG TABLET: 250 | 5 days supply | Qty: 6 | Fill #0

## 2020-02-25 MED FILL — BENZONATATE 200 MG CAP: 200 | 10 days supply | Qty: 30 | Fill #0

## 2020-02-26 ENCOUNTER — Inpatient Hospital Stay: Payer: 59

## 2020-02-28 ENCOUNTER — Other Ambulatory Visit: Payer: Self-pay | Admitting: Oncology

## 2020-02-28 ENCOUNTER — Encounter: Payer: Self-pay | Admitting: Oncology

## 2020-02-28 DIAGNOSIS — U071 COVID-19: Secondary | ICD-10-CM

## 2020-02-28 NOTE — Progress Notes (Signed)
I connected by phone with Mrs. Mensinger to discuss the potential use of an new treatment for mild to moderate COVID-19 viral infection in non-hospitalized patients.   This patient is a age/sex that meets the FDA criteria for Emergency Use Authorization of casirivimab\imdevimab.  Has a (+) direct SARS-CoV-2 viral test result 1. Has mild or moderate COVID-19  2. Is ? 44 years of age and weighs ? 40 kg 3. Is NOT hospitalized due to COVID-19 4. Is NOT requiring oxygen therapy or requiring an increase in baseline oxygen flow rate due to COVID-19 5. Is within 10 days of symptom onset 6. Has at least one of the high risk factor(s) for progression to severe COVID-19 and/or hospitalization as defined in EUA. Specific high risk criteria : Past Medical History:  Diagnosis Date  . Asthma   . Bronchitis   . Chronic interstitial cystitis   . GERD (gastroesophageal reflux disease)   . History of migraine headaches   . Hyperlipidemia   . Interstitial cystitis   . Rhinosinusitis   . Swollen lymph nodes    right side neck  . Ulcerative colitis ([place])   ?  ?    Symptom onset  02/22/2020   I have spoken and communicated the following to the patient or parent/caregiver:   1. FDA has authorized the emergency use of casirivimab\imdevimab for the treatment of mild to moderate COVID-19 in adults and pediatric patients with positive results of direct SARS-CoV-2 viral testing who are 72 years of age and older weighing at least 40 kg, and who are at high risk for progressing to severe COVID-19 and/or hospitalization.   2. The significant known and potential risks and benefits of casirivimab\imdevimab, and the extent to which such potential risks and benefits are unknown.   3. Information on available alternative treatments and the risks and benefits of those alternatives, including clinical trials.   4. Patients treated with casirivimab\imdevimab should continue to self-isolate and use infection control  measures (e.g., wear mask, isolate, social distance, avoid sharing personal items, clean and disinfect "high touch" surfaces, and frequent handwashing) according to CDC guidelines.    5. The patient or parent/caregiver has the option to accept or refuse casirivimab\imdevimab .   After reviewing this information with the patient, The patient agreed to proceed with receiving casirivimab\imdevimab infusion and will be provided a copy of the Fact sheet prior to receiving the infusion.Rulon Abide, AGNP-C [phone] ([place])

## 2020-02-29 ENCOUNTER — Ambulatory Visit (HOSPITAL_COMMUNITY)
Admission: RE | Admit: 2020-02-29 | Discharge: 2020-02-29 | Disposition: A | Discharge status: Home / Self Care | Payer: 59 | Source: Ambulatory Visit | Attending: Pulmonary Disease | Admitting: Pulmonary Disease

## 2020-02-29 DIAGNOSIS — U071 COVID-19: Secondary | ICD-10-CM | POA: Insufficient documentation

## 2020-02-29 MED ORDER — SODIUM CHLORIDE 0.9 % IV SOLN
1200.0000 mg | Freq: Once | INTRAVENOUS | Status: AC
  Administered 2020-02-29: 1200 mg via INTRAVENOUS

## 2020-02-29 MED ORDER — EPINEPHRINE 0.3 MG/0.3ML IJ SOAJ: 0.3000 mg | Freq: Once | INTRAMUSCULAR | Status: DC | PRN

## 2020-02-29 MED ORDER — SODIUM CHLORIDE 0.9 % IV SOLN: INTRAVENOUS | Status: DC | PRN

## 2020-02-29 MED ORDER — DIPHENHYDRAMINE HCL 50 MG/ML IJ SOLN: 50.0000 mg | Freq: Once | INTRAMUSCULAR | Status: DC | PRN

## 2020-02-29 MED ORDER — ALBUTEROL SULFATE HFA 108 (90 BASE) MCG/ACT IN AERS: 2.0000 | INHALATION_SPRAY | Freq: Once | RESPIRATORY_TRACT | Status: DC | PRN

## 2020-02-29 MED ORDER — FAMOTIDINE IN NACL 20-0.9 MG/50ML-% IV SOLN: 20.0000 mg | Freq: Once | INTRAVENOUS | Status: DC | PRN

## 2020-02-29 MED ORDER — METHYLPREDNISOLONE SODIUM SUCC 125 MG IJ SOLR: 125.0000 mg | Freq: Once | INTRAMUSCULAR | Status: DC | PRN

## 2020-02-29 NOTE — Progress Notes (Signed)
  Diagnosis: COVID-19  Physician:Dr Tia Masker  Procedure: [facility] Med: casirivimab\imdevimab infusion - Provided patient with casirivimab\imdevimab fact sheet for patients, parents and caregivers prior to infusion.  Complications: No immediate complications noted.  Discharge: Discharged home   [address] 02/29/2020

## 2020-02-29 NOTE — Discharge Instructions (Signed)

## 2020-03-02 MED FILL — HYDROCODON-APAP 7.5-325: 7.5-325 | 30 days supply | Qty: 120 | Fill #0

## 2020-03-02 MED FILL — DIAZEPAM 10 MG TABS: 10 | 30 days supply | Qty: 30 | Fill #0

## 2020-03-05 MED FILL — HYDROCODONE-CHLORPHEN ER SU: 10-8 | 5 days supply | Qty: 50 | Fill #0

## 2020-03-06 MED FILL — ONDANSETRON HCL 8 MG TABLET: 8 | 10 days supply | Qty: 30 | Fill #2

## 2020-03-06 MED FILL — FLUCONAZOLE 150 MG TABS: 150 | 7 days supply | Qty: 3 | Fill #0

## 2020-03-06 MED FILL — MAGIC MW LID/MAAL/DP1:1:1: 2 | 6 days supply | Qty: 120 | Fill #0

## 2020-03-06 MED FILL — CEFUROXIME AXETIL 500 MG TA: 500 | 10 days supply | Qty: 20 | Fill #0

## 2020-03-24 ENCOUNTER — Other Ambulatory Visit (HOSPITAL_COMMUNITY): Payer: Self-pay | Admitting: Allergy

## 2020-03-24 DIAGNOSIS — J4530 Mild persistent asthma, uncomplicated: Secondary | ICD-10-CM | POA: Diagnosis not present

## 2020-03-24 DIAGNOSIS — H1045 Other chronic allergic conjunctivitis: Secondary | ICD-10-CM | POA: Diagnosis not present

## 2020-03-24 DIAGNOSIS — J301 Allergic rhinitis due to pollen: Secondary | ICD-10-CM | POA: Diagnosis not present

## 2020-03-24 DIAGNOSIS — J3089 Other allergic rhinitis: Secondary | ICD-10-CM | POA: Diagnosis not present

## 2020-03-24 MED FILL — ALBUTEROL SULFATE HFA 108 (: 108 (90 BAS | 16 days supply | Qty: 18 | Fill #0

## 2020-03-24 MED FILL — SYMBICORT 160-4.5 MCG INH: 160-4.5 | 30 days supply | Qty: 10 | Fill #0

## 2020-03-24 MED FILL — ALBUTEROL 0.083 MG/ML SOLN: (2.5 MG/3ML | 5 days supply | Qty: 90 | Fill #0

## 2020-03-26 MED FILL — HYDROXYZINE HCL 25 MG TABS: 25 | 90 days supply | Qty: 270 | Fill #1

## 2020-04-01 MED FILL — DIAZEPAM 10 MG TABS: 10 | 30 days supply | Qty: 30 | Fill #1

## 2020-04-01 MED FILL — HYDROCODON-APAP 7.5-325: 7.5-325 | 30 days supply | Qty: 120 | Fill #0

## 2020-04-10 MED FILL — ONDANSETRON HCL 8 MG TABLET: 8 | 10 days supply | Qty: 30 | Fill #3

## 2020-04-10 MED FILL — buPROPion HCL ER (XL) 150 M: 150 | 30 days supply | Qty: 30 | Fill #4

## 2020-04-10 MED FILL — HYDROCHLOROTHIAZIDE 50 MG T: 50 | 30 days supply | Qty: 30 | Fill #1

## 2020-04-10 MED FILL — busPIRone HCL 5 MG TABS: 5 | 30 days supply | Qty: 60 | Fill #3

## 2020-04-15 MED FILL — LEVOTHYROXINE 125 MCG TABLE: 125 | 90 days supply | Qty: 90 | Fill #1

## 2020-05-05 ENCOUNTER — Other Ambulatory Visit (HOSPITAL_COMMUNITY): Payer: Self-pay | Admitting: Urology

## 2020-05-05 DIAGNOSIS — N200 Calculus of kidney: Secondary | ICD-10-CM | POA: Diagnosis not present

## 2020-05-05 DIAGNOSIS — N3010 Interstitial cystitis (chronic) without hematuria: Secondary | ICD-10-CM | POA: Diagnosis not present

## 2020-05-05 DIAGNOSIS — R102 Pelvic and perineal pain: Secondary | ICD-10-CM | POA: Diagnosis not present

## 2020-05-05 MED FILL — HYDROCODON-APAP 7.5-325: 7.5-325 | 30 days supply | Qty: 120 | Fill #0

## 2020-05-06 MED FILL — DIAZEPAM 10 MG TABS: 10 | 30 days supply | Qty: 30 | Fill #2

## 2020-05-11 MED FILL — ALBUTEROL SULFATE HFA 108 (: 108 (90 BAS | 17 days supply | Qty: 18 | Fill #3

## 2020-05-21 MED FILL — ONDANSETRON HCL 8 MG TABLET: 8 | 10 days supply | Qty: 30 | Fill #4

## 2020-05-21 MED FILL — MONTELUKAST SOD 10 MG TAB: 10 | 90 days supply | Qty: 90 | Fill #2

## 2020-05-28 ENCOUNTER — Other Ambulatory Visit: Payer: Self-pay

## 2020-05-28 ENCOUNTER — Inpatient Hospital Stay: Payer: 59 | Attending: Internal Medicine

## 2020-05-28 DIAGNOSIS — Z452 Encounter for adjustment and management of vascular access device: Secondary | ICD-10-CM | POA: Diagnosis not present

## 2020-05-28 DIAGNOSIS — Z23 Encounter for immunization: Secondary | ICD-10-CM

## 2020-05-28 DIAGNOSIS — D721 Eosinophilia, unspecified: Secondary | ICD-10-CM | POA: Insufficient documentation

## 2020-06-03 MED FILL — HYDROCHLOROTHIAZIDE 50 MG T: 50 | 30 days supply | Qty: 30 | Fill #2

## 2020-06-03 MED FILL — buPROPion HCL ER (XL) 150 M: 150 | 30 days supply | Qty: 30 | Fill #5

## 2020-06-04 MED FILL — HYDROCODON-APAP 7.5-325: 7.5-325 | 30 days supply | Qty: 120 | Fill #0

## 2020-06-19 MED FILL — ONDANSETRON HCL 8 MG TABLET: 8 | 10 days supply | Qty: 30 | Fill #5

## 2020-06-19 MED FILL — SYMBICORT 160-4.5 MCG INH: 160-4.5 | 30 days supply | Qty: 10 | Fill #1

## 2020-06-19 MED FILL — DIAZEPAM 10 MG TABS: 10 | 30 days supply | Qty: 30 | Fill #3

## 2020-06-19 MED FILL — OXYBUTYNIN CL ER 10 MG TAB: 10 | 30 days supply | Qty: 30 | Fill #1

## 2020-06-19 MED FILL — busPIRone HCL 5 MG TABS: 5 | 30 days supply | Qty: 60 | Fill #4

## 2020-07-03 MED FILL — HYDROCODON-APAP 7.5-325: 7.5-325 | 30 days supply | Qty: 120 | Fill #0

## 2020-07-08 MED FILL — buPROPion HCL ER (XL) 150 M: 150 | 30 days supply | Qty: 30 | Fill #6

## 2020-07-16 ENCOUNTER — Other Ambulatory Visit (HOSPITAL_COMMUNITY): Payer: Self-pay | Admitting: Family Medicine

## 2020-07-16 DIAGNOSIS — J069 Acute upper respiratory infection, unspecified: Secondary | ICD-10-CM | POA: Diagnosis not present

## 2020-07-16 DIAGNOSIS — J4531 Mild persistent asthma with (acute) exacerbation: Secondary | ICD-10-CM | POA: Diagnosis not present

## 2020-07-16 MED FILL — HYDROCHLOROTHIAZIDE 50 MG T: 50 | 30 days supply | Qty: 30 | Fill #3

## 2020-07-16 MED FILL — HYDROCODONE-CHLORPHEN ER SU: 10-8 | 5 days supply | Qty: 50 | Fill #0

## 2020-07-16 MED FILL — predniSONE 10 MG TABS: 10 | 8 days supply | Qty: 20 | Fill #0

## 2020-07-16 MED FILL — BENZONATATE 200 MG CAP: 200 | 10 days supply | Qty: 30 | Fill #0

## 2020-07-16 MED FILL — ALBUTEROL SULFATE HFA 108 (: 108 (90 BAS | 17 days supply | Qty: 18 | Fill #4

## 2020-07-16 MED FILL — CEFUROXIME AXETIL 500 MG TA: 500 | 10 days supply | Qty: 20 | Fill #0

## 2020-07-22 MED FILL — DIAZEPAM 10 MG TABS: 10 | 30 days supply | Qty: 30 | Fill #4

## 2020-08-04 ENCOUNTER — Other Ambulatory Visit (HOSPITAL_COMMUNITY): Payer: Self-pay | Admitting: Urology

## 2020-08-04 MED FILL — HYDROCODON-APAP 7.5-325: 7.5-325 | 30 days supply | Qty: 120 | Fill #0

## 2020-08-04 MED FILL — ONDANSETRON HCL 8 MG TABLET: 8 | 10 days supply | Qty: 30 | Fill #6

## 2020-08-04 MED FILL — SYMBICORT 160-4.5 MCG INH: 160-4.5 | 30 days supply | Qty: 10 | Fill #2

## 2020-08-10 DIAGNOSIS — R399 Unspecified symptoms and signs involving the genitourinary system: Secondary | ICD-10-CM | POA: Diagnosis not present

## 2020-08-14 ENCOUNTER — Other Ambulatory Visit (HOSPITAL_COMMUNITY): Payer: Self-pay | Admitting: Family Medicine

## 2020-08-14 MED FILL — buPROPion HCL ER (XL) 150 M: 150 | 30 days supply | Qty: 30 | Fill #0

## 2020-08-17 ENCOUNTER — Other Ambulatory Visit (HOSPITAL_COMMUNITY): Payer: Self-pay | Admitting: Urology

## 2020-08-17 DIAGNOSIS — N3010 Interstitial cystitis (chronic) without hematuria: Secondary | ICD-10-CM | POA: Diagnosis not present

## 2020-08-17 DIAGNOSIS — N200 Calculus of kidney: Secondary | ICD-10-CM | POA: Diagnosis not present

## 2020-08-17 DIAGNOSIS — R102 Pelvic and perineal pain: Secondary | ICD-10-CM | POA: Diagnosis not present

## 2020-08-18 MED FILL — DIAZEPAM 10 MG TABS: 10 | 30 days supply | Qty: 30 | Fill #0

## 2020-08-18 MED FILL — TAMSULOSIN HCL 0.4 MG CAP: 0.4 | 90 days supply | Qty: 90 | Fill #0

## 2020-08-18 MED FILL — HYDROXYZINE HCL 25 MG TABS: 25 | 90 days supply | Qty: 270 | Fill #0

## 2020-08-18 MED FILL — BUPIVACAINE 0.5% VIAL: 0.5 | 25 days supply | Qty: 750 | Fill #0

## 2020-08-18 MED FILL — AMITRIPTYLINE HCL 25 MG TAB: 25 | 90 days supply | Qty: 90 | Fill #0

## 2020-08-19 MED FILL — LEVOTHYROXINE 125 MCG TAB: 125 | 90 days supply | Qty: 90 | Fill #2

## 2020-09-02 MED FILL — HYDROCODON-APAP 7.5-325: 7.5-325 | 30 days supply | Qty: 120 | Fill #0

## 2020-09-03 ENCOUNTER — Other Ambulatory Visit (HOSPITAL_COMMUNITY): Payer: Self-pay | Admitting: Obstetrics and Gynecology

## 2020-09-03 MED FILL — busPIRone HCL 5 MG TABS: 5 | 30 days supply | Qty: 60 | Fill #0

## 2020-09-03 MED FILL — HYDROCHLOROTHIAZIDE 50 MG T: 50 | 30 days supply | Qty: 30 | Fill #4

## 2020-09-03 MED FILL — OXYBUTYNIN CL ER 10 MG TAB: 10 | 30 days supply | Qty: 30 | Fill #2

## 2020-09-04 DIAGNOSIS — Z1231 Encounter for screening mammogram for malignant neoplasm of breast: Secondary | ICD-10-CM | POA: Diagnosis not present

## 2020-09-24 ENCOUNTER — Other Ambulatory Visit (HOSPITAL_COMMUNITY): Payer: Self-pay

## 2020-09-24 MED FILL — Diazepam Tab 10 MG: ORAL | 30 days supply | Qty: 30 | Fill #0 | Status: AC

## 2020-09-24 MED FILL — Bupropion HCl Tab ER 24HR 150 MG: ORAL | 30 days supply | Qty: 30 | Fill #0 | Status: AC

## 2020-09-25 ENCOUNTER — Other Ambulatory Visit (HOSPITAL_COMMUNITY): Payer: Self-pay

## 2020-09-30 ENCOUNTER — Other Ambulatory Visit (HOSPITAL_COMMUNITY): Payer: Self-pay

## 2020-09-30 MED FILL — Budesonide-Formoterol Fumarate Dihyd Aerosol 160-4.5 MCG/ACT: RESPIRATORY_TRACT | 30 days supply | Qty: 10.2 | Fill #0 | Status: AC

## 2020-09-30 MED FILL — Albuterol Sulfate Inhal Aero 108 MCG/ACT (90MCG Base Equiv): RESPIRATORY_TRACT | 16 days supply | Qty: 18 | Fill #0 | Status: AC

## 2020-10-01 ENCOUNTER — Other Ambulatory Visit (HOSPITAL_COMMUNITY): Payer: Self-pay

## 2020-10-01 MED FILL — Hydrocodone-Acetaminophen Tab 7.5-325 MG: ORAL | 30 days supply | Qty: 120 | Fill #0 | Status: AC

## 2020-10-02 ENCOUNTER — Other Ambulatory Visit (HOSPITAL_COMMUNITY): Payer: Self-pay

## 2020-10-09 ENCOUNTER — Other Ambulatory Visit (HOSPITAL_COMMUNITY): Payer: Self-pay

## 2020-10-09 DIAGNOSIS — E039 Hypothyroidism, unspecified: Secondary | ICD-10-CM | POA: Diagnosis not present

## 2020-10-09 DIAGNOSIS — D229 Melanocytic nevi, unspecified: Secondary | ICD-10-CM | POA: Diagnosis not present

## 2020-10-09 DIAGNOSIS — E7800 Pure hypercholesterolemia, unspecified: Secondary | ICD-10-CM | POA: Diagnosis not present

## 2020-10-09 DIAGNOSIS — J4520 Mild intermittent asthma, uncomplicated: Secondary | ICD-10-CM | POA: Diagnosis not present

## 2020-10-09 DIAGNOSIS — J4531 Mild persistent asthma with (acute) exacerbation: Secondary | ICD-10-CM | POA: Diagnosis not present

## 2020-10-09 DIAGNOSIS — G43909 Migraine, unspecified, not intractable, without status migrainosus: Secondary | ICD-10-CM | POA: Diagnosis not present

## 2020-10-09 DIAGNOSIS — F330 Major depressive disorder, recurrent, mild: Secondary | ICD-10-CM | POA: Diagnosis not present

## 2020-10-09 DIAGNOSIS — Z0000 Encounter for general adult medical examination without abnormal findings: Secondary | ICD-10-CM | POA: Diagnosis not present

## 2020-10-09 DIAGNOSIS — R609 Edema, unspecified: Secondary | ICD-10-CM | POA: Diagnosis not present

## 2020-10-09 MED ORDER — BUPROPION HCL ER (XL) 300 MG PO TB24
ORAL_TABLET | ORAL | 3 refills | Status: DC
Start: 2020-10-09 — End: 2022-06-28
  Filled 2020-10-09: qty 90, 90d supply, fill #0
  Filled 2020-11-27: qty 90, 90d supply, fill #1
  Filled 2021-05-12: qty 90, 90d supply, fill #2
  Filled 2021-10-08: qty 90, 90d supply, fill #3

## 2020-10-09 MED ORDER — ALBUTEROL SULFATE HFA 108 (90 BASE) MCG/ACT IN AERS
INHALATION_SPRAY | RESPIRATORY_TRACT | 6 refills | Status: DC
  Filled 2020-10-09: qty 18, 16d supply, fill #0
  Filled 2020-12-16: qty 18, 16d supply, fill #1
  Filled 2021-01-21: qty 18, 16d supply, fill #2
  Filled 2021-03-23: qty 18, 16d supply, fill #3
  Filled 2021-06-10: qty 18, 16d supply, fill #4
  Filled 2021-09-07: qty 18, 16d supply, fill #5

## 2020-10-12 ENCOUNTER — Other Ambulatory Visit (HOSPITAL_COMMUNITY): Payer: Self-pay

## 2020-10-15 ENCOUNTER — Other Ambulatory Visit (HOSPITAL_COMMUNITY): Payer: Self-pay

## 2020-10-15 MED ORDER — TOPIRAMATE 50 MG PO TABS
ORAL_TABLET | ORAL | 2 refills | Status: DC
  Filled 2020-10-15: qty 270, 90d supply, fill #0
  Filled 2020-11-27: qty 270, 90d supply, fill #1

## 2020-10-15 MED FILL — Ondansetron HCl Tab 8 MG: ORAL | 10 days supply | Qty: 30 | Fill #0 | Status: AC

## 2020-10-15 MED FILL — Hydrochlorothiazide Tab 50 MG: ORAL | 30 days supply | Qty: 30 | Fill #0 | Status: AC

## 2020-10-20 DIAGNOSIS — J45909 Unspecified asthma, uncomplicated: Secondary | ICD-10-CM | POA: Insufficient documentation

## 2020-10-20 DIAGNOSIS — E7800 Pure hypercholesterolemia, unspecified: Secondary | ICD-10-CM | POA: Insufficient documentation

## 2020-10-20 DIAGNOSIS — A6000 Herpesviral infection of urogenital system, unspecified: Secondary | ICD-10-CM | POA: Insufficient documentation

## 2020-10-20 DIAGNOSIS — E039 Hypothyroidism, unspecified: Secondary | ICD-10-CM | POA: Insufficient documentation

## 2020-10-20 DIAGNOSIS — F419 Anxiety disorder, unspecified: Secondary | ICD-10-CM | POA: Insufficient documentation

## 2020-10-27 ENCOUNTER — Other Ambulatory Visit (HOSPITAL_COMMUNITY): Payer: Self-pay

## 2020-10-28 ENCOUNTER — Other Ambulatory Visit (HOSPITAL_COMMUNITY): Payer: Self-pay

## 2020-10-28 DIAGNOSIS — Z01419 Encounter for gynecological examination (general) (routine) without abnormal findings: Secondary | ICD-10-CM | POA: Diagnosis not present

## 2020-10-28 DIAGNOSIS — F418 Other specified anxiety disorders: Secondary | ICD-10-CM | POA: Diagnosis not present

## 2020-10-28 DIAGNOSIS — Z124 Encounter for screening for malignant neoplasm of cervix: Secondary | ICD-10-CM | POA: Diagnosis not present

## 2020-10-28 DIAGNOSIS — Z6830 Body mass index (BMI) 30.0-30.9, adult: Secondary | ICD-10-CM | POA: Diagnosis not present

## 2020-10-28 DIAGNOSIS — Z01411 Encounter for gynecological examination (general) (routine) with abnormal findings: Secondary | ICD-10-CM | POA: Diagnosis not present

## 2020-10-28 DIAGNOSIS — Z113 Encounter for screening for infections with a predominantly sexual mode of transmission: Secondary | ICD-10-CM | POA: Diagnosis not present

## 2020-10-28 MED ORDER — BUSPIRONE HCL 5 MG PO TABS
5.0000 mg | ORAL_TABLET | Freq: Two times a day (BID) | ORAL | 1 refills | Status: DC
Start: 2020-10-28 — End: 2022-07-28
  Filled 2020-10-28: qty 60, 30d supply, fill #0
  Filled 2021-08-09: qty 60, 30d supply, fill #1

## 2020-10-29 ENCOUNTER — Other Ambulatory Visit (HOSPITAL_COMMUNITY): Payer: Self-pay

## 2020-10-29 MED ORDER — PROMETHAZINE HCL 25 MG PO TABS
ORAL_TABLET | ORAL | 0 refills | Status: DC
Start: 2020-10-29 — End: 2021-09-08
  Filled 2020-10-29: qty 30, 8d supply, fill #0

## 2020-10-29 MED FILL — Diazepam Tab 10 MG: ORAL | 30 days supply | Qty: 30 | Fill #1 | Status: AC

## 2020-11-02 ENCOUNTER — Other Ambulatory Visit (HOSPITAL_COMMUNITY): Payer: Self-pay

## 2020-11-02 MED FILL — Hydrocodone-Acetaminophen Tab 7.5-325 MG: ORAL | 30 days supply | Qty: 120 | Fill #0 | Status: AC

## 2020-11-05 ENCOUNTER — Other Ambulatory Visit (HOSPITAL_COMMUNITY): Payer: Self-pay

## 2020-11-05 MED FILL — Oxybutynin Chloride Tab ER 24HR 10 MG: ORAL | 30 days supply | Qty: 30 | Fill #0 | Status: AC

## 2020-11-24 ENCOUNTER — Other Ambulatory Visit (HOSPITAL_COMMUNITY): Payer: Self-pay | Admitting: Urology

## 2020-11-24 ENCOUNTER — Other Ambulatory Visit (HOSPITAL_COMMUNITY): Payer: Self-pay

## 2020-11-24 DIAGNOSIS — R399 Unspecified symptoms and signs involving the genitourinary system: Secondary | ICD-10-CM | POA: Diagnosis not present

## 2020-11-26 ENCOUNTER — Other Ambulatory Visit (HOSPITAL_COMMUNITY): Payer: Self-pay

## 2020-11-27 ENCOUNTER — Other Ambulatory Visit (HOSPITAL_COMMUNITY): Payer: Self-pay

## 2020-11-27 ENCOUNTER — Other Ambulatory Visit (HOSPITAL_COMMUNITY): Payer: Self-pay | Admitting: Urology

## 2020-11-27 MED ORDER — BUSPIRONE HCL 5 MG PO TABS
5.0000 mg | ORAL_TABLET | Freq: Two times a day (BID) | ORAL | 0 refills | Status: DC
  Filled 2020-11-27: qty 60, 30d supply, fill #0

## 2020-11-27 MED ORDER — HYDROCODONE-ACETAMINOPHEN 7.5-325 MG PO TABS
ORAL_TABLET | ORAL | 0 refills | Status: DC
  Filled 2020-11-27 (×2): qty 120, 30d supply, fill #0

## 2020-11-27 MED ORDER — HYDROCODONE-ACETAMINOPHEN 7.5-325 MG PO TABS
1.0000 | ORAL_TABLET | Freq: Four times a day (QID) | ORAL | 0 refills | Status: DC | PRN
  Filled 2021-05-11: qty 120, 30d supply, fill #0

## 2020-11-27 MED ORDER — HYDROCODONE-ACETAMINOPHEN 7.5-325 MG PO TABS
1.0000 | ORAL_TABLET | Freq: Four times a day (QID) | ORAL | 0 refills | Status: DC | PRN
  Filled 2021-04-09: qty 120, 30d supply, fill #0

## 2020-11-27 MED FILL — Hydrochlorothiazide Tab 50 MG: ORAL | 30 days supply | Qty: 30 | Fill #1 | Status: AC

## 2020-11-27 MED FILL — Diazepam Tab 10 MG: ORAL | 30 days supply | Qty: 30 | Fill #2 | Status: AC

## 2020-11-27 MED FILL — Budesonide-Formoterol Fumarate Dihyd Aerosol 160-4.5 MCG/ACT: RESPIRATORY_TRACT | 30 days supply | Qty: 10.2 | Fill #1 | Status: AC

## 2020-11-27 MED FILL — Oxybutynin Chloride Tab ER 24HR 10 MG: ORAL | 30 days supply | Qty: 30 | Fill #1 | Status: AC

## 2020-11-28 ENCOUNTER — Other Ambulatory Visit (HOSPITAL_COMMUNITY): Payer: Self-pay

## 2020-11-28 MED ORDER — LEVOTHYROXINE SODIUM 125 MCG PO TABS
ORAL_TABLET | ORAL | 0 refills | Status: DC
  Filled 2020-11-28: qty 90, 90d supply, fill #0

## 2020-11-30 ENCOUNTER — Other Ambulatory Visit (HOSPITAL_COMMUNITY): Payer: Self-pay

## 2020-12-01 ENCOUNTER — Other Ambulatory Visit (HOSPITAL_COMMUNITY): Payer: Self-pay

## 2020-12-02 ENCOUNTER — Other Ambulatory Visit (HOSPITAL_COMMUNITY): Payer: Self-pay

## 2020-12-02 MED ORDER — VALACYCLOVIR HCL 500 MG PO TABS
500.0000 mg | ORAL_TABLET | Freq: Every day | ORAL | 3 refills | Status: DC
  Filled 2020-12-02: qty 90, 90d supply, fill #0

## 2020-12-03 ENCOUNTER — Other Ambulatory Visit (HOSPITAL_COMMUNITY): Payer: Self-pay

## 2020-12-04 ENCOUNTER — Other Ambulatory Visit (HOSPITAL_COMMUNITY): Payer: Self-pay

## 2020-12-16 ENCOUNTER — Other Ambulatory Visit (HOSPITAL_COMMUNITY): Payer: Self-pay

## 2020-12-17 ENCOUNTER — Other Ambulatory Visit (HOSPITAL_COMMUNITY): Payer: Self-pay

## 2020-12-17 MED ORDER — NYSTATIN 100000 UNIT/GM EX OINT
TOPICAL_OINTMENT | CUTANEOUS | 1 refills | Status: DC
  Filled 2020-12-17: qty 15, 7d supply, fill #0

## 2020-12-17 MED ORDER — TRIAMCINOLONE ACETONIDE 0.1 % EX OINT
TOPICAL_OINTMENT | CUTANEOUS | 1 refills | Status: DC
  Filled 2020-12-17: qty 15, 7d supply, fill #0

## 2020-12-17 MED ORDER — FLUCONAZOLE 150 MG PO TABS
ORAL_TABLET | ORAL | 0 refills | Status: DC
  Filled 2020-12-17: qty 3, 7d supply, fill #0

## 2020-12-18 ENCOUNTER — Other Ambulatory Visit (HOSPITAL_COMMUNITY): Payer: Self-pay

## 2020-12-21 ENCOUNTER — Other Ambulatory Visit (HOSPITAL_COMMUNITY): Payer: Self-pay

## 2020-12-29 ENCOUNTER — Other Ambulatory Visit (HOSPITAL_COMMUNITY): Payer: Self-pay

## 2020-12-31 ENCOUNTER — Other Ambulatory Visit (HOSPITAL_COMMUNITY): Payer: Self-pay

## 2020-12-31 MED ORDER — TERCONAZOLE 0.4 % VA CREA
TOPICAL_CREAM | VAGINAL | 0 refills | Status: DC
  Filled 2020-12-31: qty 45, 7d supply, fill #0

## 2021-01-04 ENCOUNTER — Other Ambulatory Visit (HOSPITAL_COMMUNITY): Payer: Self-pay

## 2021-01-04 DIAGNOSIS — N3010 Interstitial cystitis (chronic) without hematuria: Secondary | ICD-10-CM | POA: Diagnosis not present

## 2021-01-04 MED ORDER — HYDROCODONE-ACETAMINOPHEN 7.5-325 MG PO TABS
ORAL_TABLET | ORAL | 0 refills | Status: DC
  Filled 2021-01-04: qty 120, 30d supply, fill #0

## 2021-01-04 MED ORDER — NITROFURANTOIN MACROCRYSTAL 50 MG PO CAPS
ORAL_CAPSULE | ORAL | 99 refills | Status: DC
  Filled 2021-01-04: qty 30, 30d supply, fill #0

## 2021-01-04 MED ORDER — HYDROCODONE-ACETAMINOPHEN 7.5-325 MG PO TABS
1.0000 | ORAL_TABLET | Freq: Four times a day (QID) | ORAL | 0 refills | Status: DC | PRN
  Filled 2021-01-04 – 2021-02-05 (×2): qty 120, 30d supply, fill #0

## 2021-01-04 MED ORDER — MONTELUKAST SODIUM 10 MG PO TABS
ORAL_TABLET | ORAL | 3 refills | Status: DC
  Filled 2021-01-04: qty 90, 90d supply, fill #0
  Filled 2021-07-01: qty 90, 90d supply, fill #1
  Filled 2021-09-07 – 2021-10-01 (×3): qty 90, 90d supply, fill #2

## 2021-01-04 MED ORDER — MONTELUKAST SODIUM 10 MG PO TABS
ORAL_TABLET | ORAL | 99 refills | Status: DC
  Filled 2021-01-04: qty 30, 30d supply, fill #0

## 2021-01-04 MED ORDER — DIAZEPAM 10 MG PO TABS
ORAL_TABLET | ORAL | 5 refills | Status: DC
  Filled 2021-01-04: qty 30, 30d supply, fill #0
  Filled 2021-02-05: qty 30, 30d supply, fill #1
  Filled 2021-03-11: qty 30, 30d supply, fill #2
  Filled 2021-04-22: qty 30, 30d supply, fill #3
  Filled 2021-05-27: qty 30, 30d supply, fill #4
  Filled 2021-07-01: qty 30, 30d supply, fill #5

## 2021-01-04 MED ORDER — OXYBUTYNIN CHLORIDE ER 10 MG PO TB24
10.0000 mg | ORAL_TABLET | Freq: Every day | ORAL | 3 refills | Status: DC
  Filled 2021-01-04: qty 90, 90d supply, fill #0

## 2021-01-04 MED ORDER — HYDROCODONE-ACETAMINOPHEN 7.5-325 MG PO TABS
1.0000 | ORAL_TABLET | Freq: Four times a day (QID) | ORAL | 0 refills | Status: DC | PRN
  Filled 2021-01-04 – 2021-03-11 (×2): qty 120, 30d supply, fill #0

## 2021-01-04 MED ORDER — PHENAZOPYRIDINE HCL 200 MG PO TABS
ORAL_TABLET | ORAL | 99 refills | Status: DC
  Filled 2021-01-04: qty 100, 30d supply, fill #0

## 2021-01-05 ENCOUNTER — Other Ambulatory Visit (HOSPITAL_COMMUNITY): Payer: Self-pay

## 2021-01-12 ENCOUNTER — Other Ambulatory Visit (HOSPITAL_COMMUNITY): Payer: Self-pay

## 2021-01-12 MED ORDER — CARESTART COVID-19 HOME TEST VI KIT
PACK | 0 refills | Status: DC
  Filled 2021-01-12: qty 4, 4d supply, fill #0

## 2021-01-20 ENCOUNTER — Other Ambulatory Visit (HOSPITAL_COMMUNITY): Payer: Self-pay

## 2021-01-20 MED FILL — Hydrochlorothiazide Tab 50 MG: ORAL | 30 days supply | Qty: 30 | Fill #2 | Status: AC

## 2021-01-21 ENCOUNTER — Other Ambulatory Visit (HOSPITAL_COMMUNITY): Payer: Self-pay

## 2021-01-21 MED ORDER — PREVIDENT 5000 SENSITIVE 1.1-5 % DT GEL
DENTAL | 2 refills | Status: AC
  Filled 2021-01-21: qty 100, 30d supply, fill #0

## 2021-01-22 ENCOUNTER — Other Ambulatory Visit (HOSPITAL_COMMUNITY): Payer: Self-pay

## 2021-01-23 ENCOUNTER — Other Ambulatory Visit (HOSPITAL_COMMUNITY): Payer: Self-pay

## 2021-02-03 DIAGNOSIS — E7800 Pure hypercholesterolemia, unspecified: Secondary | ICD-10-CM | POA: Diagnosis not present

## 2021-02-03 DIAGNOSIS — E039 Hypothyroidism, unspecified: Secondary | ICD-10-CM | POA: Diagnosis not present

## 2021-02-05 ENCOUNTER — Other Ambulatory Visit (HOSPITAL_COMMUNITY): Payer: Self-pay

## 2021-02-17 ENCOUNTER — Other Ambulatory Visit (HOSPITAL_COMMUNITY): Payer: Self-pay

## 2021-02-17 MED ORDER — PAXLOVID (300/100) 20 X 150 MG & 10 X 100MG PO TBPK
ORAL_TABLET | ORAL | 0 refills | Status: DC
  Filled 2021-02-17: qty 30, 5d supply, fill #0

## 2021-02-22 ENCOUNTER — Other Ambulatory Visit (HOSPITAL_COMMUNITY): Payer: Self-pay

## 2021-03-02 ENCOUNTER — Other Ambulatory Visit (HOSPITAL_COMMUNITY): Payer: Self-pay

## 2021-03-02 ENCOUNTER — Other Ambulatory Visit (HOSPITAL_COMMUNITY): Payer: Self-pay | Admitting: Urology

## 2021-03-02 MED ORDER — BUSPIRONE HCL 5 MG PO TABS
5.0000 mg | ORAL_TABLET | Freq: Two times a day (BID) | ORAL | 0 refills | Status: DC
  Filled 2021-03-02: qty 60, 30d supply, fill #0

## 2021-03-02 MED ORDER — LEVOTHYROXINE SODIUM 125 MCG PO TABS
125.0000 ug | ORAL_TABLET | Freq: Every morning | ORAL | 2 refills | Status: DC
  Filled 2021-03-02: qty 90, 90d supply, fill #0
  Filled 2021-08-09: qty 90, 90d supply, fill #1
  Filled 2022-01-14: qty 90, 90d supply, fill #2

## 2021-03-02 MED ORDER — TOPIRAMATE 50 MG PO TABS
150.0000 mg | ORAL_TABLET | Freq: Every day | ORAL | 2 refills | Status: AC
  Filled 2021-03-02: qty 270, 90d supply, fill #0
  Filled 2022-02-22: qty 270, 90d supply, fill #1

## 2021-03-02 MED ORDER — CARESTART COVID-19 HOME TEST VI KIT
PACK | 0 refills | Status: DC
  Filled 2021-03-02: qty 4, 4d supply, fill #0

## 2021-03-04 ENCOUNTER — Other Ambulatory Visit (HOSPITAL_COMMUNITY): Payer: Self-pay

## 2021-03-04 MED ORDER — HYDROCHLOROTHIAZIDE 50 MG PO TABS
50.0000 mg | ORAL_TABLET | Freq: Every day | ORAL | 11 refills | Status: DC
  Filled 2021-03-04: qty 30, 30d supply, fill #0
  Filled 2021-04-22: qty 30, 30d supply, fill #1
  Filled 2021-06-02: qty 30, 30d supply, fill #2
  Filled 2021-08-09: qty 30, 30d supply, fill #3
  Filled 2021-10-01: qty 30, 30d supply, fill #4
  Filled 2021-11-24: qty 30, 30d supply, fill #5
  Filled 2022-01-14: qty 30, 30d supply, fill #6
  Filled 2022-02-22: qty 30, 30d supply, fill #7

## 2021-03-08 DIAGNOSIS — N200 Calculus of kidney: Secondary | ICD-10-CM | POA: Diagnosis not present

## 2021-03-08 DIAGNOSIS — M545 Low back pain, unspecified: Secondary | ICD-10-CM | POA: Diagnosis not present

## 2021-03-08 DIAGNOSIS — R102 Pelvic and perineal pain: Secondary | ICD-10-CM | POA: Diagnosis not present

## 2021-03-11 ENCOUNTER — Other Ambulatory Visit (HOSPITAL_COMMUNITY): Payer: Self-pay

## 2021-03-23 ENCOUNTER — Other Ambulatory Visit (HOSPITAL_COMMUNITY): Payer: Self-pay

## 2021-03-24 ENCOUNTER — Other Ambulatory Visit (HOSPITAL_COMMUNITY): Payer: Self-pay

## 2021-03-24 DIAGNOSIS — J3089 Other allergic rhinitis: Secondary | ICD-10-CM | POA: Diagnosis not present

## 2021-03-24 DIAGNOSIS — H1045 Other chronic allergic conjunctivitis: Secondary | ICD-10-CM | POA: Diagnosis not present

## 2021-03-24 DIAGNOSIS — J4530 Mild persistent asthma, uncomplicated: Secondary | ICD-10-CM | POA: Diagnosis not present

## 2021-03-24 DIAGNOSIS — J301 Allergic rhinitis due to pollen: Secondary | ICD-10-CM | POA: Diagnosis not present

## 2021-03-24 MED ORDER — PREDNISONE 10 MG (21) PO TBPK
ORAL_TABLET | ORAL | 0 refills | Status: DC
  Filled 2021-03-24: qty 21, 6d supply, fill #0

## 2021-03-24 MED ORDER — MONTELUKAST SODIUM 10 MG PO TABS
10.0000 mg | ORAL_TABLET | Freq: Every evening | ORAL | 6 refills | Status: DC
  Filled 2021-03-24: qty 30, 30d supply, fill #0
  Filled 2021-05-12: qty 30, 30d supply, fill #1

## 2021-03-24 MED ORDER — LEVOCETIRIZINE DIHYDROCHLORIDE 5 MG PO TABS
2.5000 mg | ORAL_TABLET | Freq: Every evening | ORAL | 6 refills | Status: DC
  Filled 2021-03-24: qty 30, 60d supply, fill #0

## 2021-03-24 MED ORDER — FLUTICASONE PROPIONATE 50 MCG/ACT NA SUSP
NASAL | 6 refills | Status: DC
  Filled 2021-03-24: qty 16, 30d supply, fill #0
  Filled 2022-02-10: qty 16, 30d supply, fill #1

## 2021-03-24 MED ORDER — BUDESONIDE-FORMOTEROL FUMARATE 160-4.5 MCG/ACT IN AERO
INHALATION_SPRAY | RESPIRATORY_TRACT | 6 refills | Status: DC
  Filled 2021-03-24: qty 10.2, 30d supply, fill #0
  Filled 2021-05-20: qty 10.2, 30d supply, fill #1
  Filled 2021-07-01: qty 10.2, 30d supply, fill #2
  Filled 2021-08-09: qty 10.2, 30d supply, fill #3
  Filled 2021-11-09: qty 10.2, 30d supply, fill #4
  Filled 2022-01-14: qty 10.2, 30d supply, fill #5

## 2021-03-24 MED ORDER — ALBUTEROL SULFATE HFA 108 (90 BASE) MCG/ACT IN AERS
INHALATION_SPRAY | RESPIRATORY_TRACT | 0 refills | Status: DC
  Filled 2021-03-24: qty 18, 16d supply, fill #0

## 2021-03-24 MED ORDER — AZELASTINE HCL 0.05 % OP SOLN
OPHTHALMIC | 3 refills | Status: DC
  Filled 2021-03-24: qty 6, 24d supply, fill #0

## 2021-03-24 MED ORDER — AZELASTINE HCL 0.1 % NA SOLN
NASAL | 6 refills | Status: DC
  Filled 2021-03-24: qty 30, 25d supply, fill #0
  Filled 2022-02-10: qty 30, 25d supply, fill #1

## 2021-03-24 MED ORDER — AEROCHAMBER PLUS MISC: 1 refills | Status: DC

## 2021-03-25 ENCOUNTER — Other Ambulatory Visit (HOSPITAL_COMMUNITY): Payer: Self-pay

## 2021-03-25 MED ORDER — OPTICHAMBER DIAMOND MISC
1 refills | Status: AC
  Filled 2021-03-25: qty 1, 30d supply, fill #0

## 2021-03-26 ENCOUNTER — Other Ambulatory Visit (HOSPITAL_COMMUNITY): Payer: Self-pay

## 2021-04-01 ENCOUNTER — Other Ambulatory Visit (HOSPITAL_COMMUNITY): Payer: Self-pay

## 2021-04-01 DIAGNOSIS — J019 Acute sinusitis, unspecified: Secondary | ICD-10-CM | POA: Diagnosis not present

## 2021-04-01 DIAGNOSIS — J4530 Mild persistent asthma, uncomplicated: Secondary | ICD-10-CM | POA: Diagnosis not present

## 2021-04-01 DIAGNOSIS — J301 Allergic rhinitis due to pollen: Secondary | ICD-10-CM | POA: Diagnosis not present

## 2021-04-01 DIAGNOSIS — J209 Acute bronchitis, unspecified: Secondary | ICD-10-CM | POA: Diagnosis not present

## 2021-04-01 MED ORDER — BENZONATATE 100 MG PO CAPS
ORAL_CAPSULE | ORAL | 0 refills | Status: DC
  Filled 2021-04-01: qty 21, 7d supply, fill #0

## 2021-04-01 MED ORDER — AZITHROMYCIN 250 MG PO TABS
ORAL_TABLET | ORAL | 0 refills | Status: DC
  Filled 2021-04-01: qty 6, 5d supply, fill #0

## 2021-04-01 MED ORDER — PREDNISONE 10 MG (48) PO TBPK
ORAL_TABLET | ORAL | 0 refills | Status: DC
  Filled 2021-04-01: qty 48, 12d supply, fill #0

## 2021-04-09 ENCOUNTER — Other Ambulatory Visit (HOSPITAL_COMMUNITY): Payer: Self-pay

## 2021-04-12 ENCOUNTER — Other Ambulatory Visit (HOSPITAL_COMMUNITY): Payer: Self-pay

## 2021-04-12 MED ORDER — NYSTATIN 100000 UNIT/ML MT SUSP
OROMUCOSAL | 0 refills | Status: DC
  Filled 2021-04-12: qty 480, 7d supply, fill #0

## 2021-04-12 MED ORDER — FLUCONAZOLE 150 MG PO TABS
ORAL_TABLET | ORAL | 0 refills | Status: DC
  Filled 2021-04-12: qty 3, 7d supply, fill #0

## 2021-04-13 ENCOUNTER — Other Ambulatory Visit (HOSPITAL_COMMUNITY): Payer: Self-pay

## 2021-04-14 ENCOUNTER — Other Ambulatory Visit (HOSPITAL_COMMUNITY): Payer: Self-pay

## 2021-04-16 ENCOUNTER — Other Ambulatory Visit (HOSPITAL_COMMUNITY): Payer: Self-pay

## 2021-04-20 ENCOUNTER — Other Ambulatory Visit (HOSPITAL_COMMUNITY): Payer: Self-pay

## 2021-04-22 ENCOUNTER — Other Ambulatory Visit (HOSPITAL_COMMUNITY): Payer: Self-pay

## 2021-05-11 ENCOUNTER — Other Ambulatory Visit (HOSPITAL_COMMUNITY): Payer: Self-pay

## 2021-05-12 ENCOUNTER — Other Ambulatory Visit (HOSPITAL_COMMUNITY): Payer: Self-pay

## 2021-05-12 ENCOUNTER — Other Ambulatory Visit (HOSPITAL_COMMUNITY): Payer: Self-pay | Admitting: Urology

## 2021-05-13 ENCOUNTER — Other Ambulatory Visit (HOSPITAL_COMMUNITY): Payer: Self-pay

## 2021-05-13 MED FILL — Hydroxyzine HCl Tab 25 MG: ORAL | 90 days supply | Qty: 270 | Fill #0 | Status: AC

## 2021-05-17 DIAGNOSIS — N3010 Interstitial cystitis (chronic) without hematuria: Secondary | ICD-10-CM | POA: Diagnosis not present

## 2021-05-17 DIAGNOSIS — R102 Pelvic and perineal pain: Secondary | ICD-10-CM | POA: Diagnosis not present

## 2021-05-18 ENCOUNTER — Other Ambulatory Visit (HOSPITAL_COMMUNITY): Payer: Self-pay

## 2021-05-18 MED ORDER — HYDROCODONE-ACETAMINOPHEN 7.5-325 MG PO TABS
ORAL_TABLET | ORAL | 0 refills | Status: DC
  Filled 2021-05-18 – 2021-07-12 (×2): qty 120, 30d supply, fill #0

## 2021-05-18 MED ORDER — POTASSIUM CITRATE ER 15 MEQ (1620 MG) PO TBCR
EXTENDED_RELEASE_TABLET | ORAL | 11 refills | Status: DC
  Filled 2021-05-18: qty 60, 30d supply, fill #0

## 2021-05-18 MED ORDER — HYDROCODONE-ACETAMINOPHEN 7.5-325 MG PO TABS
1.0000 | ORAL_TABLET | Freq: Four times a day (QID) | ORAL | 0 refills | Status: DC | PRN
  Filled 2021-06-11: qty 120, 30d supply, fill #0

## 2021-05-18 MED ORDER — HYDROCODONE-ACETAMINOPHEN 7.5-325 MG PO TABS
1.0000 | ORAL_TABLET | Freq: Four times a day (QID) | ORAL | 0 refills | Status: DC | PRN
  Filled 2021-08-10: qty 120, 30d supply, fill #0

## 2021-05-20 ENCOUNTER — Other Ambulatory Visit (HOSPITAL_COMMUNITY): Payer: Self-pay

## 2021-05-24 ENCOUNTER — Other Ambulatory Visit (HOSPITAL_COMMUNITY): Payer: Self-pay

## 2021-05-26 ENCOUNTER — Other Ambulatory Visit (HOSPITAL_COMMUNITY): Payer: Self-pay

## 2021-05-26 MED ORDER — ONDANSETRON HCL 8 MG PO TABS
8.0000 mg | ORAL_TABLET | Freq: Three times a day (TID) | ORAL | 5 refills | Status: DC | PRN
  Filled 2021-05-26: qty 30, 10d supply, fill #0
  Filled 2021-07-01: qty 30, 10d supply, fill #1
  Filled 2021-08-09: qty 30, 10d supply, fill #2
  Filled 2021-09-07: qty 30, 10d supply, fill #3
  Filled 2021-10-08: qty 30, 10d supply, fill #4
  Filled 2021-11-24: qty 30, 10d supply, fill #5

## 2021-05-27 ENCOUNTER — Other Ambulatory Visit (HOSPITAL_COMMUNITY): Payer: Self-pay

## 2021-05-28 ENCOUNTER — Other Ambulatory Visit (HOSPITAL_COMMUNITY): Payer: Self-pay

## 2021-06-02 ENCOUNTER — Other Ambulatory Visit (HOSPITAL_COMMUNITY): Payer: Self-pay

## 2021-06-04 ENCOUNTER — Other Ambulatory Visit (HOSPITAL_BASED_OUTPATIENT_CLINIC_OR_DEPARTMENT_OTHER): Payer: Self-pay

## 2021-06-04 MED ORDER — CARESTART COVID-19 HOME TEST VI KIT
PACK | 0 refills | Status: DC
Start: 2021-06-04 — End: 2021-07-23
  Filled 2021-06-04: qty 4, 4d supply, fill #0

## 2021-06-10 ENCOUNTER — Other Ambulatory Visit (HOSPITAL_COMMUNITY): Payer: Self-pay

## 2021-06-11 ENCOUNTER — Other Ambulatory Visit (HOSPITAL_COMMUNITY): Payer: Self-pay

## 2021-06-21 ENCOUNTER — Other Ambulatory Visit (HOSPITAL_COMMUNITY): Payer: Self-pay

## 2021-06-21 DIAGNOSIS — J3089 Other allergic rhinitis: Secondary | ICD-10-CM | POA: Diagnosis not present

## 2021-06-21 DIAGNOSIS — J4530 Mild persistent asthma, uncomplicated: Secondary | ICD-10-CM | POA: Diagnosis not present

## 2021-06-21 DIAGNOSIS — J301 Allergic rhinitis due to pollen: Secondary | ICD-10-CM | POA: Diagnosis not present

## 2021-06-21 DIAGNOSIS — H1045 Other chronic allergic conjunctivitis: Secondary | ICD-10-CM | POA: Diagnosis not present

## 2021-06-21 DIAGNOSIS — J209 Acute bronchitis, unspecified: Secondary | ICD-10-CM | POA: Diagnosis not present

## 2021-06-21 MED ORDER — AZITHROMYCIN 250 MG PO TABS
ORAL_TABLET | ORAL | 0 refills | Status: DC
  Filled 2021-06-21: qty 6, 5d supply, fill #0

## 2021-06-21 MED ORDER — PREDNISONE 10 MG (21) PO TBPK
ORAL_TABLET | ORAL | 0 refills | Status: DC
  Filled 2021-06-21: qty 21, 6d supply, fill #0

## 2021-06-23 DIAGNOSIS — J4530 Mild persistent asthma, uncomplicated: Secondary | ICD-10-CM | POA: Diagnosis not present

## 2021-07-01 ENCOUNTER — Other Ambulatory Visit (HOSPITAL_COMMUNITY): Payer: Self-pay

## 2021-07-09 ENCOUNTER — Other Ambulatory Visit (HOSPITAL_COMMUNITY): Payer: Self-pay

## 2021-07-12 ENCOUNTER — Other Ambulatory Visit (HOSPITAL_COMMUNITY): Payer: Self-pay

## 2021-07-23 ENCOUNTER — Other Ambulatory Visit (HOSPITAL_COMMUNITY): Payer: Self-pay

## 2021-07-23 MED ORDER — CARESTART COVID-19 HOME TEST VI KIT
PACK | 0 refills | Status: DC
  Filled 2021-07-23: qty 4, 4d supply, fill #0
  Filled 2021-07-23: qty 4, 30d supply, fill #0

## 2021-07-23 MED ORDER — BUSPIRONE HCL 5 MG PO TABS
5.0000 mg | ORAL_TABLET | Freq: Two times a day (BID) | ORAL | 0 refills | Status: DC
  Filled 2021-07-23: qty 60, 30d supply, fill #0

## 2021-08-03 ENCOUNTER — Other Ambulatory Visit (HOSPITAL_COMMUNITY): Payer: Self-pay

## 2021-08-09 ENCOUNTER — Other Ambulatory Visit (HOSPITAL_COMMUNITY): Payer: Self-pay

## 2021-08-10 ENCOUNTER — Other Ambulatory Visit (HOSPITAL_COMMUNITY): Payer: Self-pay

## 2021-08-10 MED ORDER — DIAZEPAM 10 MG PO TABS
ORAL_TABLET | ORAL | 5 refills | Status: DC
  Filled 2021-08-10: qty 30, 30d supply, fill #0
  Filled 2021-09-07: qty 30, 30d supply, fill #1
  Filled 2021-10-19: qty 30, 30d supply, fill #2
  Filled 2021-11-24: qty 30, 30d supply, fill #3
  Filled 2022-01-06: qty 30, 30d supply, fill #4

## 2021-08-13 DIAGNOSIS — R3915 Urgency of urination: Secondary | ICD-10-CM | POA: Insufficient documentation

## 2021-08-13 DIAGNOSIS — R350 Frequency of micturition: Secondary | ICD-10-CM | POA: Insufficient documentation

## 2021-08-13 DIAGNOSIS — J4530 Mild persistent asthma, uncomplicated: Secondary | ICD-10-CM | POA: Insufficient documentation

## 2021-08-13 DIAGNOSIS — H1045 Other chronic allergic conjunctivitis: Secondary | ICD-10-CM | POA: Insufficient documentation

## 2021-08-13 DIAGNOSIS — N9489 Other specified conditions associated with female genital organs and menstrual cycle: Secondary | ICD-10-CM | POA: Insufficient documentation

## 2021-08-16 ENCOUNTER — Encounter: Payer: Self-pay | Admitting: Internal Medicine

## 2021-08-20 ENCOUNTER — Other Ambulatory Visit (HOSPITAL_COMMUNITY): Payer: Self-pay

## 2021-08-20 DIAGNOSIS — N3010 Interstitial cystitis (chronic) without hematuria: Secondary | ICD-10-CM | POA: Diagnosis not present

## 2021-08-20 DIAGNOSIS — N9489 Other specified conditions associated with female genital organs and menstrual cycle: Secondary | ICD-10-CM | POA: Diagnosis not present

## 2021-08-20 DIAGNOSIS — N200 Calculus of kidney: Secondary | ICD-10-CM | POA: Diagnosis not present

## 2021-08-20 DIAGNOSIS — R350 Frequency of micturition: Secondary | ICD-10-CM | POA: Diagnosis not present

## 2021-08-20 DIAGNOSIS — R109 Unspecified abdominal pain: Secondary | ICD-10-CM | POA: Diagnosis not present

## 2021-08-20 DIAGNOSIS — R3915 Urgency of urination: Secondary | ICD-10-CM | POA: Diagnosis not present

## 2021-08-20 MED ORDER — HYDROXYZINE HCL 25 MG PO TABS
ORAL_TABLET | ORAL | 3 refills | Status: AC
  Filled 2021-08-20 – 2022-02-10 (×2): qty 270, 90d supply, fill #0

## 2021-08-20 MED ORDER — TAMSULOSIN HCL 0.4 MG PO CAPS
ORAL_CAPSULE | ORAL | 99 refills | Status: DC
  Filled 2021-08-20: qty 30, 30d supply, fill #0

## 2021-08-20 MED ORDER — HYDROCODONE-ACETAMINOPHEN 7.5-325 MG PO TABS
ORAL_TABLET | ORAL | 0 refills | Status: DC
  Filled 2021-09-07: qty 120, 30d supply, fill #0

## 2021-08-20 MED ORDER — HYDROCODONE-ACETAMINOPHEN 7.5-325 MG PO TABS
1.0000 | ORAL_TABLET | Freq: Four times a day (QID) | ORAL | 0 refills | Status: DC | PRN
  Filled 2021-10-08: qty 120, 30d supply, fill #0

## 2021-08-20 MED ORDER — AMITRIPTYLINE HCL 25 MG PO TABS
ORAL_TABLET | ORAL | 3 refills | Status: DC
  Filled 2021-08-20: qty 90, 90d supply, fill #0

## 2021-08-20 MED ORDER — HYDROCODONE-ACETAMINOPHEN 7.5-325 MG PO TABS
1.0000 | ORAL_TABLET | Freq: Four times a day (QID) | ORAL | 0 refills | Status: DC | PRN
  Filled 2021-11-09: qty 70, 18d supply, fill #0
  Filled 2021-11-09: qty 50, 12d supply, fill #0

## 2021-08-20 MED ORDER — BUPIVACAINE HCL (PF) 0.5 % IJ SOLN
INTRAMUSCULAR | 99 refills | Status: DC
  Filled 2021-08-20: qty 750, 25d supply, fill #0

## 2021-08-23 DIAGNOSIS — N200 Calculus of kidney: Secondary | ICD-10-CM | POA: Diagnosis not present

## 2021-08-23 DIAGNOSIS — K8020 Calculus of gallbladder without cholecystitis without obstruction: Secondary | ICD-10-CM | POA: Diagnosis not present

## 2021-08-24 ENCOUNTER — Telehealth: Payer: Self-pay | Admitting: Internal Medicine

## 2021-08-24 NOTE — Telephone Encounter (Signed)
Pt requesting sooner appointment due to recent ultrasound that was done and and pt having GERD symptoms: Pt rescheduled to 09/08/2021 at 10:10 AM with Dr. Carlean Purl   Pt made aware:  ?

## 2021-08-24 NOTE — Telephone Encounter (Signed)
Patient has an upcoming appointment with Dr. Carlean Purl in May; however, she just recently found out via ultrasound that she has gallstones.  She wanted to see if she could be seen sooner rather than wait until May.  Please call patient and advise.  Thank you. ?

## 2021-09-01 ENCOUNTER — Other Ambulatory Visit (HOSPITAL_COMMUNITY): Payer: Self-pay

## 2021-09-07 ENCOUNTER — Other Ambulatory Visit (HOSPITAL_COMMUNITY): Payer: Self-pay

## 2021-09-08 ENCOUNTER — Other Ambulatory Visit (HOSPITAL_COMMUNITY): Payer: Self-pay

## 2021-09-08 ENCOUNTER — Ambulatory Visit: Payer: 59 | Admitting: Internal Medicine

## 2021-09-08 ENCOUNTER — Encounter: Payer: Self-pay | Admitting: Internal Medicine

## 2021-09-08 VITALS — BP 118/74 | HR 84 | Ht 64.0 in | Wt 183.0 lb

## 2021-09-08 DIAGNOSIS — R12 Heartburn: Secondary | ICD-10-CM | POA: Diagnosis not present

## 2021-09-08 DIAGNOSIS — K8020 Calculus of gallbladder without cholecystitis without obstruction: Secondary | ICD-10-CM

## 2021-09-08 DIAGNOSIS — R1013 Epigastric pain: Secondary | ICD-10-CM

## 2021-09-08 DIAGNOSIS — Z1211 Encounter for screening for malignant neoplasm of colon: Secondary | ICD-10-CM

## 2021-09-08 MED ORDER — ESOMEPRAZOLE MAGNESIUM 40 MG PO CPDR
40.0000 mg | DELAYED_RELEASE_CAPSULE | Freq: Every day | ORAL | 0 refills | Status: DC
  Filled 2021-09-08: qty 90, 90d supply, fill #0

## 2021-09-08 NOTE — Progress Notes (Signed)
? ?[name] 46 y.o. 1975-12-17 [id number] ? ?Assessment & Plan:  ? ?Encounter Diagnoses  ?Name Primary?  ? Heartburn - despite PPI Yes  ? Dyspepsia - despite PPI   ? Gallstones   ? Colon cancer screening   ? ? ?Change PPI from pantoprazole to generic Nexium 40 mg daily. ?Schedule EGD to evaluate upper GI symptoms.  I do not think gallstones are causing this but we will also do a right upper quadrant ultrasound. ? ?Colon cancer screening with colonoscopy.  She has had a few episodes of blood on the toilet paper in the past 6 months it sounds like anorectal bleeding i.e. hemorrhoids so I think screening is the appropriate indication for her colonoscopy. ? ?It sounds like she is on a reflux prevention diet which goes along with an interstitial cystitis symptom prevention diet.  Bear in mind that patients with interstitial cystitis have a higher incidence of functional GI disorders as well.  She may have functional dyspepsia. ? ?The risks and benefits as well as alternatives of endoscopic procedure(s) have been discussed and reviewed. All questions answered. The patient agrees to proceed. ? ? ?CC: Shirline Frees, MD ? ? ?Subjective:  ? ?Chief Complaint: Indigestion, gallstones, blood on toilet paper ? ?HPI ?46 year old white woman with a history of what I thought was abdominal migraine in the past, also has interstitial cystitis and high tone pelvic floor dysfunction, asthma and suspected GERD here with several complaints. ? ?For the past several months despite taking pantoprazole 40 mg daily for years she has had a lot of constant indigestion and dyspeptic symptoms with burning in the epigastrium and some radiation up into the chest.  It is relatively constant.  It has disrupted sleep a few times but not persistently.  She complains of some increased belching as well.  There is been nausea but no vomiting.  No dysphagia. ? ?In the past 6 months she has had a few episodes of slight bright red blood on the  toilet paper.  She was taking NSAIDs for back pain and has not seen it since quitting those.  Only on the toilet paper without bowel habit changes.  No lower abdominal pain. ? ?She was having in the urinary or GU ultrasound and incidental gallstones were found but not characterized further on March 2023 GU ultrasound.  She is concerned about these and requests further investigation. ? ?Brief diet history 1 small orange juice in the morning but otherwise limits citrus and tomato products, 1 cup of coffee daily no sodas ? ?She uses Norco 7.5 intermittently for pain related to IC, chronically ? ?CMET 08/20/2021 normal ?CBC same day white count 11.1 otherwise normal ?Ultrasound 08/23/2021 [facility], normal kidneys no hydronephrosis, nonobstructing left renal calculi and cholelithiasis noted but no other description of the gallbladder ?Allergies  ?Allergen Reactions  ? Latex Rash and Other (See Comments)  ?  wheezing  ? Pork-Derived Products   ? ?Current Meds  ?Medication Sig  ? albuterol (VENTOLIN HFA) 108 (90 Base) MCG/ACT inhaler Inhale 2 puffs by mouth every 4 hours as needed  ? albuterol (VENTOLIN HFA) 108 (90 Base) MCG/ACT inhaler Inhale 1-2 puffs by mouth every 4 hours as needed for coughing or wheezing  ? azelastine (ASTELIN) 0.1 % nasal spray 1 spray by Nasal route 2 (two) times daily. Use in each nostril as directed   ? azelastine (ASTELIN) 0.1 % nasal spray Use 1-2 sprays in each nostril 2 times a day  ? azelastine (OPTIVAR) 0.05 %  ophthalmic solution Place 1 drop into affected eye 2 times a day  ? budesonide-formoterol (SYMBICORT) 160-4.5 MCG/ACT inhaler Inhale 2 puffs by mouth 2 times a day  ? bupivacaine,PF, (MARCAINE) 0.5 % SOLN injection Instill 15 MLs into bladder daily - Discard each vial after use.  ? buPROPion (WELLBUTRIN XL) 300 MG 24 hr tablet TAKE 1 TABLET BY MOUTH IN THE MORNING ONCE A DAY  ? busPIRone (BUSPAR) 5 MG tablet TAKE 1 TABLET BY MOUTH TWO TIMES DAILY  ? diazepam (VALIUM) 10 MG tablet Insert  1 tablet into the vagina daily  ? esomeprazole (NEXIUM) 40 MG capsule Take 1 capsule (40 mg total) by mouth daily before breakfast.  ? fluticasone (FLONASE) 50 MCG/ACT nasal spray Use 1-2 sprays in each nostril daily  ? hydrochlorothiazide (HYDRODIURIL) 50 MG tablet TAKE 1 TABLET BY MOUTH ONCE A DAY  ? HYDROcodone-acetaminophen (NORCO) 7.5-325 MG tablet Take 1 tablet by mouth every 6 hours as needed. * May fill on 08/10/21  ? HYDROcodone-acetaminophen (NORCO) 7.5-325 MG tablet Take 1 tablet by mouth every 6 hours as needed for Pain.  ? [START ON 11/07/2021] HYDROcodone-acetaminophen (NORCO) 7.5-325 MG tablet Take 1 tablet by mouth every 6 hours as needed.  ? [START ON 10/08/2021] HYDROcodone-acetaminophen (NORCO) 7.5-325 MG tablet Take 1 tablet by mouth every 6  hours as needed.  ? HYDROcodone-acetaminophen (VICODIN ES) 7.5-750 MG per tablet Take 1 tablet by mouth every 6 (six) hours as needed.  ? hydrocortisone 2.5 % cream Apply topically 2 (two) times daily.  ? hydrocortisone-nystatin in diphenhydrAMINE liquid Swish and spit or swallow 1-2 teaspoonsful (5-10 ml) 4 times daily for 7 days.  ? hydrOXYzine (ATARAX) 25 MG tablet Take 25 mg by mouth 3 (three) times daily as needed.    ? hydrOXYzine (ATARAX) 25 MG tablet TAKE 1 TO 3 TABLETS BY MOUTH AT BEDTIME  ? levalbuterol (XOPENEX) 1.25 MG/3ML nebulizer solution Take 1 ampule by nebulization every 4 (four) hours as needed.    ? levocetirizine (XYZAL) 5 MG tablet Take 1/2 tablets (2.5 mg total) by mouth every evening.  ? levothyroxine (SYNTHROID) 125 MCG tablet Take 1 tablet by mouth every morning on an empty stomach  ? Loratadine 10 MG CAPS Take 1 capsule by mouth daily.    ? mometasone (NASONEX) 50 MCG/ACT nasal spray Place 2 sprays into the nose daily.   ? montelukast (SINGULAIR) 10 MG tablet Take 1 tablet (10 mg total) by mouth nightly.  ? montelukast (SINGULAIR) 10 MG tablet Take 1 tablet (10 mg total) by mouth every evening.  ? nitrofurantoin (MACRODANTIN) 50 MG  capsule Take 1 capsule (50 mg total) by mouth nightly.  ? ondansetron (ZOFRAN) 8 MG tablet TAKE 1 TABLET BY MOUTH EVERY 8 HOURS AS NEEDED FOR NAUSEA.  ? oxybutynin (DITROPAN-XL) 10 MG 24 hr tablet Take 1 tablet (10 mg total) by mouth daily.  ? Sod Fluoride-Potassium Nitrate (PREVIDENT 5000 SENSITIVE) 1.1-5 % GEL Brush as directed  ? Spacer/Aero-Holding Chambers (AEROCHAMBER PLUS) inhaler use as directed  ? Spacer/Aero-Holding Chambers [facility] DIAMOND) MISC Use as directed  ? tamsulosin (FLOMAX) 0.4 MG CAPS capsule Take 1 capsule by mouth once per day as needed when stone symptoms are present.  ? topiramate (TOPAMAX) 25 MG tablet start with 3 tabs at night for 1 week, then increase to 4 tabs at night from then.  ? topiramate (TOPAMAX) 50 MG tablet TAKE 3 TABLETS BY MOUTH ONCE DAILY  ? [DISCONTINUED] budesonide-formoterol (SYMBICORT) 160-4.5 MCG/ACT inhaler Inhale 2 puffs into the  lungs 2 (two) times daily.    ? [DISCONTINUED] busPIRone (BUSPAR) 5 MG tablet Take 1 tablet by mouth twice daily  ? [DISCONTINUED] HYDROcodone-acetaminophen (NORCO) 7.5-325 MG tablet Take 1 tablet by mouth every 6 (six) hours as needed for pain.  ? [DISCONTINUED] HYDROcodone-acetaminophen (NORCO) 7.5-325 MG tablet Take 1 tablet by mouth every 6 (six) hours as needed (fill 12/27/20)  ? [DISCONTINUED] HYDROcodone-acetaminophen (NORCO) 7.5-325 MG tablet Take 1 tablet by mouth every 6 (six) hours as needed  ? [DISCONTINUED] HYDROcodone-acetaminophen (NORCO) 7.5-325 MG tablet Take 1 tablet by mouth every 6 hours as needed for Pain.  ? [DISCONTINUED] HYDROcodone-acetaminophen (NORCO) 7.5-325 MG tablet Take 1 tablet by mouth every 6 (six) hours as needed.  ? [DISCONTINUED] HYDROcodone-acetaminophen (NORCO) 7.5-325 MG tablet Take 1 tablet by mouth every 6 (six) hours as needed.  ? [DISCONTINUED] HYDROcodone-acetaminophen (NORCO) 7.5-325 MG tablet Take 1 tablet by mouth every 6 hours as needed.  ? [DISCONTINUED] HYDROcodone-acetaminophen (NORCO)  7.5-325 MG tablet Take 1 tablet by mouth every 6 hours as needed for pain.  ? [DISCONTINUED] montelukast (SINGULAIR) 10 MG tablet Take 10 mg by mouth at bedtime.   ? [DISCONTINUED] montelukast (SINGUL

## 2021-09-08 NOTE — Patient Instructions (Signed)
You have been scheduled for an endoscopy and colonoscopy. Please follow the written instructions given to you at your visit today. ?Please pick up your prep supplies at the pharmacy within the next 1-3 days. ?If you use inhalers (even only as needed), please bring them with you on the day of your procedure. ? ?You have been scheduled for an abdominal ultrasound at [facility] (1st floor of hospital) on ________________ at ____________. Please arrive 15 minutes prior to your appointment for registration. Make certain not to have anything to eat or drink 6 hours prior to your appointment. Should you need to reschedule your appointment, please contact radiology at [phone]. This test typically takes about 30 minutes to perform. ? ?You will be contacted by [facility] in the next 2 days to arrange a RUQ U/S.  The number on your caller ID will be [phone], please answer when they call.  If you have not heard from them in 2 days please call [phone] to schedule.   ? ?We have sent the following medications to your pharmacy for you to pick up at your convenience: ?Nexium to replace your Pantoprazole ? ?I appreciate the opportunity to care for you. ?Silvano Rusk, MD, [facility] ? ? ?

## 2021-09-13 ENCOUNTER — Other Ambulatory Visit (HOSPITAL_COMMUNITY): Payer: Self-pay

## 2021-09-16 ENCOUNTER — Other Ambulatory Visit (HOSPITAL_COMMUNITY): Payer: Self-pay

## 2021-09-17 ENCOUNTER — Ambulatory Visit (HOSPITAL_COMMUNITY)
Admission: RE | Admit: 2021-09-17 | Discharge: 2021-09-17 | Disposition: A | Discharge status: Home / Self Care | Payer: 59 | Source: Ambulatory Visit | Attending: Internal Medicine | Admitting: Internal Medicine

## 2021-09-17 DIAGNOSIS — Z1231 Encounter for screening mammogram for malignant neoplasm of breast: Secondary | ICD-10-CM | POA: Diagnosis not present

## 2021-09-17 DIAGNOSIS — K8020 Calculus of gallbladder without cholecystitis without obstruction: Secondary | ICD-10-CM | POA: Insufficient documentation

## 2021-09-21 ENCOUNTER — Other Ambulatory Visit (HOSPITAL_COMMUNITY): Payer: Self-pay

## 2021-10-01 ENCOUNTER — Other Ambulatory Visit (HOSPITAL_COMMUNITY): Payer: Self-pay

## 2021-10-01 DIAGNOSIS — J4530 Mild persistent asthma, uncomplicated: Secondary | ICD-10-CM | POA: Diagnosis not present

## 2021-10-01 DIAGNOSIS — H1045 Other chronic allergic conjunctivitis: Secondary | ICD-10-CM | POA: Diagnosis not present

## 2021-10-01 DIAGNOSIS — J301 Allergic rhinitis due to pollen: Secondary | ICD-10-CM | POA: Diagnosis not present

## 2021-10-01 DIAGNOSIS — J3089 Other allergic rhinitis: Secondary | ICD-10-CM | POA: Diagnosis not present

## 2021-10-01 DIAGNOSIS — R928 Other abnormal and inconclusive findings on diagnostic imaging of breast: Secondary | ICD-10-CM | POA: Diagnosis not present

## 2021-10-01 DIAGNOSIS — R922 Inconclusive mammogram: Secondary | ICD-10-CM | POA: Diagnosis not present

## 2021-10-06 ENCOUNTER — Other Ambulatory Visit (HOSPITAL_COMMUNITY): Payer: Self-pay

## 2021-10-08 ENCOUNTER — Other Ambulatory Visit (HOSPITAL_COMMUNITY): Payer: Self-pay

## 2021-10-15 ENCOUNTER — Ambulatory Visit: Payer: 59 | Admitting: Internal Medicine

## 2021-10-15 ENCOUNTER — Other Ambulatory Visit: Payer: Self-pay | Admitting: Radiology

## 2021-10-15 ENCOUNTER — Encounter: Payer: Self-pay | Admitting: Internal Medicine

## 2021-10-15 DIAGNOSIS — R928 Other abnormal and inconclusive findings on diagnostic imaging of breast: Secondary | ICD-10-CM | POA: Diagnosis not present

## 2021-10-15 DIAGNOSIS — N6012 Diffuse cystic mastopathy of left breast: Secondary | ICD-10-CM | POA: Diagnosis not present

## 2021-10-15 HISTORY — PX: BREAST BIOPSY: SHX20

## 2021-10-19 ENCOUNTER — Other Ambulatory Visit (HOSPITAL_COMMUNITY): Payer: Self-pay

## 2021-10-22 ENCOUNTER — Encounter: Payer: Self-pay | Admitting: Internal Medicine

## 2021-10-22 ENCOUNTER — Ambulatory Visit (AMBULATORY_SURGERY_CENTER): Payer: 59 | Admitting: Internal Medicine

## 2021-10-22 ENCOUNTER — Other Ambulatory Visit (HOSPITAL_COMMUNITY): Payer: Self-pay

## 2021-10-22 VITALS — BP 118/74 | HR 92 | Temp 98.4°F | Resp 16 | Ht 64.0 in | Wt 183.0 lb

## 2021-10-22 DIAGNOSIS — R12 Heartburn: Secondary | ICD-10-CM

## 2021-10-22 DIAGNOSIS — K30 Functional dyspepsia: Secondary | ICD-10-CM | POA: Diagnosis not present

## 2021-10-22 DIAGNOSIS — Z1211 Encounter for screening for malignant neoplasm of colon: Secondary | ICD-10-CM | POA: Diagnosis not present

## 2021-10-22 DIAGNOSIS — E785 Hyperlipidemia, unspecified: Secondary | ICD-10-CM | POA: Diagnosis not present

## 2021-10-22 DIAGNOSIS — J45909 Unspecified asthma, uncomplicated: Secondary | ICD-10-CM | POA: Diagnosis not present

## 2021-10-22 MED ORDER — SODIUM CHLORIDE 0.9 % IV SOLN: 500.0000 mL | INTRAVENOUS | Status: DC

## 2021-10-22 MED ORDER — SUCRALFATE 1 G PO TABS
1.0000 g | ORAL_TABLET | Freq: Three times a day (TID) | ORAL | 2 refills | Status: DC
  Filled 2021-10-22: qty 120, 30d supply, fill #0

## 2021-10-22 NOTE — Op Note (Signed)
[name] ?Patient Name: [name] ?Procedure Date: 10/22/2021 3:33 PM ?MRN: [id number] ?Endoscopist: Gatha Mayer , MD ?Age: 46 ?Referring MD:  ?Date of Birth: 09-24-1975 ?Gender: Female ?Account #: [id number] ?Procedure:                Upper GI endoscopy ?Indications:              Dyspepsia, Heartburn, Esophageal reflux symptoms  ?                          that persist despite appropriate therapy ?Medicines:                Monitored Anesthesia Care ?Procedure:                Pre-Anesthesia Assessment: ?                          - Prior to the procedure, a History and Physical  ?                          was performed, and patient medications and  ?                          allergies were reviewed. The patient's tolerance of  ?                          previous anesthesia was also reviewed. The risks  ?                          and benefits of the procedure and the sedation  ?                          options and risks were discussed with the patient.  ?                          All questions were answered, and informed consent  ?                          was obtained. Prior Anticoagulants: The patient has  ?                          taken no previous anticoagulant or antiplatelet  ?                          agents. ASA Grade Assessment: II - A patient with  ?                          mild systemic disease. After reviewing the risks  ?                          and benefits, the patient was deemed in  ?                          satisfactory condition to undergo the procedure. ?  After obtaining informed consent, the endoscope was  ?                          passed under direct vision. Throughout the  ?                          procedure, the patient's blood pressure, pulse, and  ?                          oxygen saturations were monitored continuously. The  ?                          Endoscope was introduced through the mouth, and  ?                          advanced to the  second part of duodenum. The upper  ?                          GI endoscopy was accomplished without difficulty.  ?                          The patient tolerated the procedure well. ?Scope In: ?Scope Out: ?Findings:                 The esophagus was normal. ?                          The stomach was normal. ?                          The examined duodenum was normal. ?                          The cardia and gastric fundus were normal on  ?                          retroflexion. ?Complications:            No immediate complications. ?Estimated Blood Loss:     Estimated blood loss: none. ?Impression:               - Normal esophagus. ?                          - Normal stomach. ?                          - Normal examined duodenum. ?                          - No specimens collected. ?Recommendation:           - Patient has a contact number available for  ?                          emergencies. The signs and symptoms of potential  ?  delayed complications were discussed with the  ?                          patient. Return to normal activities tomorrow.  ?                          Written discharge instructions were provided to the  ?                          patient. ?                          - See the other procedure note for documentation of  ?                          additional recommendations. ?                          - tray carafate for PPI refractory heartburn and  ?                          dyspepsia ?                          Set up office visit w/ me 6 weeks ?Gatha Mayer, MD ?10/22/2021 4:17:26 PM ?This report has been signed electronically. ?

## 2021-10-22 NOTE — Progress Notes (Signed)
[facility] History and Physical ? ? ?Primary Care Physician:  Shirline Frees, MD ? ? ?Reason for Procedure:   Heartburn and dyspepsia despite PPI, colon cancer screening ? ?Plan:    EGD, colonoscopy ? ? ? ? ?HPI: [name] is a 46 y.o. female here to evaluate persistent heartburn and dyspepsia despite taking PPI and also for colon cancer screening ? ? ?Past Medical History:  ?Diagnosis Date  ? Asthma   ? Bronchitis   ? Chronic interstitial cystitis   ? GERD (gastroesophageal reflux disease)   ? History of migraine headaches   ? Hyperlipidemia   ? Interstitial cystitis   ? Rhinosinusitis   ? Swollen lymph nodes   ? right side neck  ? ? ?Past Surgical History:  ?Procedure Laterality Date  ? BREAST BIOPSY  10/15/2021  ? CESAREAN SECTION  [id number]  ? x2  ? DILATION AND CURETTAGE OF UTERUS  06/13/2001  ? TONSILECTOMY, ADENOIDECTOMY, BILATERAL MYRINGOTOMY AND TUBES    ? UPPER GASTROINTESTINAL ENDOSCOPY  05/11/2005; 12/21/2010  ? 2006 Normal; 2012 erosive gastritis  ? ? ?Prior to Admission medications   ?Medication Sig Start Date End Date Taking? Authorizing Provider  ?albuterol (VENTOLIN HFA) 108 (90 Base) MCG/ACT inhaler Inhale 2 puffs by mouth every 4 hours as needed 10/09/20  Yes   ?azelastine (ASTELIN) 0.1 % nasal spray Use 1-2 sprays in each nostril 2 times a day 03/24/21  Yes   ?azelastine (OPTIVAR) 0.05 % ophthalmic solution Place 1 drop into affected eye 2 times a day 03/24/21  Yes   ?budesonide-formoterol (SYMBICORT) 160-4.5 MCG/ACT inhaler Inhale 2 puffs by mouth 2 times a day 03/24/21  Yes   ?buPROPion (WELLBUTRIN XL) 300 MG 24 hr tablet TAKE 1 TABLET BY MOUTH IN THE MORNING ONCE A DAY 10/09/20  Yes   ?busPIRone (BUSPAR) 5 MG tablet TAKE 1 TABLET BY MOUTH TWO TIMES DAILY 10/28/20  Yes   ?diazepam (VALIUM) 10 MG tablet Insert 1 tablet into the vagina daily 08/10/21  Yes   ?esomeprazole (NEXIUM) 40 MG capsule Take 1 capsule (40 mg total) by mouth daily before breakfast. 09/08/21  Yes Gatha Mayer, MD  ?fluticasone Asencion Islam) 50 MCG/ACT nasal spray Use 1-2 sprays in each nostril daily 03/24/21  Yes   ?hydrochlorothiazide (HYDRODIURIL) 50 MG tablet TAKE 1 TABLET BY MOUTH ONCE A DAY 03/04/21  Yes   ?HYDROcodone-acetaminophen (NORCO) 7.5-325 MG tablet Take 1 tablet by mouth every 6 hours as needed. * May fill on 08/10/21 08/10/21  Yes   ?hydrOXYzine (ATARAX) 25 MG tablet TAKE 1 TO 3 TABLETS BY MOUTH AT BEDTIME 08/20/21  Yes   ?levothyroxine (SYNTHROID) 125 MCG tablet Take 1 tablet by mouth every morning on an empty stomach 03/02/21  Yes   ?Loratadine 10 MG CAPS Take 1 capsule by mouth daily.     Yes [provider]  ?mometasone (NASONEX) 50 MCG/ACT nasal spray Place 2 sprays into the nose daily.    Yes [provider]  ?montelukast (SINGULAIR) 10 MG tablet Take 1 tablet (10 mg total) by mouth nightly. 01/04/21  Yes   ?ondansetron (ZOFRAN) 8 MG tablet TAKE 1 TABLET BY MOUTH EVERY 8 HOURS AS NEEDED FOR NAUSEA. 05/26/21  Yes   ?Sod Fluoride-Potassium Nitrate (PREVIDENT 5000 SENSITIVE) 1.1-5 % GEL Brush as directed 01/20/21  Yes   ?topiramate (TOPAMAX) 50 MG tablet TAKE 3 TABLETS BY MOUTH ONCE DAILY 03/02/21  Yes   ?budesonide-formoterol (SYMBICORT) 160-4.5 MCG/ACT inhaler INHALE 2 PUFFS BY MOUTH TWICE DAILY 03/24/20  03/24/21  Mosetta Anis, MD  ?bupivacaine,PF, (MARCAINE) 0.5 % SOLN injection Instill 15 MLs into bladder daily - Discard each vial after use. ?Patient not taking: Reported on 10/22/2021 08/20/21     ?COVID-19 At Home Antigen Test [facility] COVID-19 HOME TEST) KIT Use as directed. ?Patient not taking: Reported on 09/08/2021 03/02/21   Edmon Crape, [facility]  ?eletriptan (RELPAX) 40 MG tablet One tablet by mouth at onset of headache. May repeat in 2 hours if headache persists or recurs. 01/20/12 01/19/13  Clearnce Sorrel, MD  ?HYDROcodone-acetaminophen (NORCO) 7.5-325 MG tablet Take 1 tablet by mouth every 6 hours as needed for Pain. 08/20/21     ?HYDROcodone-acetaminophen (NORCO) 7.5-325 MG  tablet Take 1 tablet by mouth every 6 hours as needed. 11/07/21     ?HYDROcodone-acetaminophen (NORCO) 7.5-325 MG tablet Take 1 tablet by mouth every 6  hours as needed. 10/08/21     ?HYDROcodone-acetaminophen (VICODIN ES) 7.5-750 MG per tablet Take 1 tablet by mouth every 6 (six) hours as needed. 01/20/12   Clearnce Sorrel, MD  ?hydrocortisone 2.5 % cream Apply topically 2 (two) times daily. ?Patient not taking: Reported on 10/22/2021 09/19/19   Couture, Cortni S, PA-C  ?hydrocortisone-nystatin in diphenhydrAMINE liquid Swish and spit or swallow 1-2 teaspoonsful (5-10 ml) 4 times daily for 7 days. ?Patient not taking: Reported on 10/22/2021 04/12/21   Mosetta Anis, MD  ?hydrOXYzine (ATARAX) 25 MG tablet Take 25 mg by mouth 3 (three) times daily as needed.      [provider]  ?ketoprofen (ORUDIS) 75 MG capsule Take 1 capsule (75 mg total) by mouth 3 (three) times daily as needed. ?Patient not taking: Reported on 09/08/2021 01/20/12   Clearnce Sorrel, MD  ?levalbuterol Penne Lash) 1.25 MG/3ML nebulizer solution Take 1 ampule by nebulization every 4 (four) hours as needed.      [provider]  ?levocetirizine (XYZAL) 5 MG tablet Take 1/2 tablets (2.5 mg total) by mouth every evening. 03/24/21     ?metoCLOPramide (REGLAN) 10 MG tablet Take 10 mg by mouth as needed.   ?Patient not taking: Reported on 09/08/2021    [provider]  ?montelukast (SINGULAIR) 10 MG tablet TAKE 1 TABLET BY MOUTH NIGHTLY 11/07/19 11/06/20  Domingo Pulse, MD  ?montelukast (SINGULAIR) 10 MG tablet Take 1 tablet (10 mg total) by mouth every evening. 03/24/21     ?nitrofurantoin (MACRODANTIN) 50 MG capsule Take 1 capsule (50 mg total) by mouth nightly. 01/04/21     ?oxybutynin (DITROPAN-XL) 10 MG 24 hr tablet Take 1 tablet (10 mg total) by mouth daily. 01/04/21     ?Potassium Citrate 15 MEQ (1620 MG) TBCR Take 1 tablet by mouth 2 times daily. ?Patient not taking: Reported on 09/08/2021 05/17/21     ?Spacer/Aero-Holding Chambers  (AEROCHAMBER PLUS) inhaler use as directed 03/24/21     ?Spacer/Aero-Holding [facility] DIAMOND) MISC Use as directed 03/25/21   Mosetta Anis, MD  ?tamsulosin (FLOMAX) 0.4 MG CAPS capsule Take 1 capsule by mouth once per day as needed when stone symptoms are present. 08/20/21     ?topiramate (TOPAMAX) 25 MG tablet start with 3 tabs at night for 1 week, then increase to 4 tabs at night from then. ?Patient not taking: Reported on 10/22/2021 11/10/11   Clearnce Sorrel, MD  ?traMADol (ULTRAM) 50 MG tablet Take 50 mg by mouth every 6 (six) hours as needed.   ?Patient not taking: Reported on 09/08/2021    [provider]  ? ? ?  Current Outpatient Medications  ?Medication Sig Dispense Refill  ? albuterol (VENTOLIN HFA) 108 (90 Base) MCG/ACT inhaler Inhale 2 puffs by mouth every 4 hours as needed 18 g 6  ? azelastine (ASTELIN) 0.1 % nasal spray Use 1-2 sprays in each nostril 2 times a day 30 mL 6  ? azelastine (OPTIVAR) 0.05 % ophthalmic solution Place 1 drop into affected eye 2 times a day 6 mL 3  ? budesonide-formoterol (SYMBICORT) 160-4.5 MCG/ACT inhaler Inhale 2 puffs by mouth 2 times a day 10.2 g 6  ? buPROPion (WELLBUTRIN XL) 300 MG 24 hr tablet TAKE 1 TABLET BY MOUTH IN THE MORNING ONCE A DAY 90 tablet 3  ? busPIRone (BUSPAR) 5 MG tablet TAKE 1 TABLET BY MOUTH TWO TIMES DAILY 60 tablet 1  ? diazepam (VALIUM) 10 MG tablet Insert 1 tablet into the vagina daily 30 tablet 5  ? esomeprazole (NEXIUM) 40 MG capsule Take 1 capsule (40 mg total) by mouth daily before breakfast. 90 capsule 0  ? fluticasone (FLONASE) 50 MCG/ACT nasal spray Use 1-2 sprays in each nostril daily 16 g 6  ? hydrochlorothiazide (HYDRODIURIL) 50 MG tablet TAKE 1 TABLET BY MOUTH ONCE A DAY 30 tablet 11  ? HYDROcodone-acetaminophen (NORCO) 7.5-325 MG tablet Take 1 tablet by mouth every 6 hours as needed. * May fill on 08/10/21 120 tablet 0  ? hydrOXYzine (ATARAX) 25 MG tablet TAKE 1 TO 3 TABLETS BY MOUTH AT BEDTIME 270 tablet 3  ?  levothyroxine (SYNTHROID) 125 MCG tablet Take 1 tablet by mouth every morning on an empty stomach 90 tablet 2  ? Loratadine 10 MG CAPS Take 1 capsule by mouth daily.      ? mometasone (NASONEX) 50 MCG/ACT nasal spray

## 2021-10-22 NOTE — Op Note (Signed)
[place] ?Patient Name: [name] ?Procedure Date: 10/22/2021 3:32 PM ?MRN: [id number] ?Endoscopist: Gatha Mayer , MD ?Age: 46 ?Referring MD:  ?Date of Birth: 05-16-1976 ?Gender: Female ?Account #: [id number] ?Procedure:                Colonoscopy ?Indications:              Screening for colorectal malignant neoplasm, This  ?                          is the patient's first colonoscopy ?Medicines:                Monitored Anesthesia Care ?Procedure:                Pre-Anesthesia Assessment: ?                          - Prior to the procedure, a History and Physical  ?                          was performed, and patient medications and  ?                          allergies were reviewed. The patient's tolerance of  ?                          previous anesthesia was also reviewed. The risks  ?                          and benefits of the procedure and the sedation  ?                          options and risks were discussed with the patient.  ?                          All questions were answered, and informed consent  ?                          was obtained. Prior Anticoagulants: The patient has  ?                          taken no previous anticoagulant or antiplatelet  ?                          agents. ASA Grade Assessment: II - A patient with  ?                          mild systemic disease. After reviewing the risks  ?                          and benefits, the patient was deemed in  ?                          satisfactory condition to undergo the procedure. ?  After obtaining informed consent, the colonoscope  ?                          was passed under direct vision. Throughout the  ?                          procedure, the patient's blood pressure, pulse, and  ?                          oxygen saturations were monitored continuously. The  ?                          CF [id number] #[id number] was introduced through the anus  ?                          and advanced to the  the cecum, identified by  ?                          appendiceal orifice and ileocecal valve. The  ?                          colonoscopy was performed without difficulty. The  ?                          patient tolerated the procedure well. The quality  ?                          of the bowel preparation was excellent. The bowel  ?                          preparation used was Miralax via split dose  ?                          instruction. The ileocecal valve, appendiceal  ?                          orifice, and rectum were photographed. ?Scope In: 3:49:02 PM ?Scope Out: 4:04:08 PM ?Scope Withdrawal Time: 0 hours 11 minutes 43 seconds  ?Total Procedure Duration: 0 hours 15 minutes 6 seconds  ?Findings:                 The perianal and digital rectal examinations were  ?                          normal. ?                          External hemorrhoids were found. The hemorrhoids  ?                          were small. ?                          The exam was otherwise without abnormality on  ?  direct and retroflexion views. ?Complications:            No immediate complications. ?Estimated Blood Loss:     Estimated blood loss: none. ?Impression:               - External hemorrhoids. ?                          - The examination was otherwise normal on direct  ?                          and retroflexion views. ?                          - No specimens collected. ?Recommendation:           - Patient has a contact number available for  ?                          emergencies. The signs and symptoms of potential  ?                          delayed complications were discussed with the  ?                          patient. Return to normal activities tomorrow.  ?                          Written discharge instructions were provided to the  ?                          patient. ?                          - Resume previous diet. ?                          - Continue present medications. ?                           - Repeat colonoscopy in 10 years for screening  ?                          purposes. ?Gatha Mayer, MD ?10/22/2021 4:19:41 PM ?This report has been signed electronically. ?

## 2021-10-22 NOTE — Progress Notes (Signed)
Sedate, gd SR, tolerated procedure well, VSS, report to RN 

## 2021-10-22 NOTE — Patient Instructions (Addendum)
The upper endoscopy exam looks normal. No acid damage, ulcers or other problems. ? ?I am prescribing carafate to see if that helps you feel better. ? ?Colonoscopy w/ small hemorrhoids but all ok otherwise. ? ?Next routine colonoscopy or other screening test in 10 years - 2033. ? ?I will have my staff set up a follow-up appointment for about 6 weeks from now. ? ?I appreciate the opportunity to care for you. ?Gatha Mayer, MD, [facility] ? ? ?YOU HAD AN ENDOSCOPIC PROCEDURE TODAY AT [facility]:   Refer to the procedure report that was given to you for any specific questions about what was found during the examination.  If the procedure report does not answer your questions, please call your gastroenterologist to clarify.  If you requested that your care partner not be given the details of your procedure findings, then the procedure report has been included in a sealed envelope for you to review at your convenience later. ? ?YOU SHOULD EXPECT: Some feelings of bloating in the abdomen. Passage of more gas than usual.  Walking can help get rid of the air that was put into your GI tract during the procedure and reduce the bloating. If you had a lower endoscopy (such as a colonoscopy or flexible sigmoidoscopy) you may notice spotting of blood in your stool or on the toilet paper. If you underwent a bowel prep for your procedure, you may not have a normal bowel movement for a few days. ? ?Please Note:  You might notice some irritation and congestion in your nose or some drainage.  This is from the oxygen used during your procedure.  There is no need for concern and it should clear up in a day or so. ? ?SYMPTOMS TO REPORT IMMEDIATELY: ? ?Following lower endoscopy (colonoscopy or flexible sigmoidoscopy): ? Excessive amounts of blood in the stool ? Significant tenderness or worsening of abdominal pains ? Swelling of the abdomen that is new, acute ? Fever of 100?F or higher ? ?Following upper endoscopy  (EGD) ? Vomiting of blood or coffee ground material ? New chest pain or pain under the shoulder blades ? Painful or persistently difficult swallowing ? New shortness of breath ? Fever of 100?F or higher ? Black, tarry-looking stools ? ?For urgent or emergent issues, a gastroenterologist can be reached at any hour by calling [phone]. ?Do not use MyChart messaging for urgent concerns.  ? ? ?DIET:  We do recommend a small meal at first, but then you may proceed to your regular diet.  Drink plenty of fluids but you should avoid alcoholic beverages for 24 hours. ? ?ACTIVITY:  You should plan to take it easy for the rest of today and you should NOT DRIVE or use heavy machinery until tomorrow (because of the sedation medicines used during the test).   ? ?FOLLOW UP: ?Our staff will call the number listed on your records 48-72 hours following your procedure to check on you and address any questions or concerns that you may have regarding the information given to you following your procedure. If we do not reach you, we will leave a message.  We will attempt to reach you two times.  During this call, we will ask if you have developed any symptoms of COVID 19. If you develop any symptoms (ie: fever, flu-like symptoms, shortness of breath, cough etc.) before then, please call [phone].  If you test positive for Covid 19 in the 2 weeks post procedure, please call and  report this information to [place].   ? ?If any biopsies were taken you will be contacted by phone or by letter within the next 1-3 weeks.  Please call us at [phone] if you have not heard about the biopsies in 3 weeks.  ? ? ?SIGNATURES/CONFIDENTIALITY: ?You and/or your care partner have signed paperwork which will be entered into your electronic medical record.  These signatures attest to the fact that that the information above on your After Visit Summary has been reviewed and is understood.  Full responsibility of the confidentiality of this discharge  information lies with you and/or your care-partner.  ?

## 2021-10-26 ENCOUNTER — Telehealth: Payer: Self-pay

## 2021-10-26 NOTE — Telephone Encounter (Signed)
?  Follow up Call- ? ? ?  10/22/2021  ?  2:50 PM  ?Call back number  ?Post procedure Call Back phone  # [phone]  ?Permission to leave phone message Yes  ?  ? ?Patient questions: ? ?Do you have a fever, pain , or abdominal swelling? No. ?Pain Score  0 * ? ?Have you tolerated food without any problems? Yes.   ? ?Have you been able to return to your normal activities? Yes.   ? ?Do you have any questions about your discharge instructions: ?Diet   No. ?Medications  No. ?Follow up visit  No. ? ?Do you have questions or concerns about your Care? No. ? ?Actions: ?* If pain score is 4 or above: ?No action needed, pain <4. ? ? ?

## 2021-11-03 ENCOUNTER — Other Ambulatory Visit (HOSPITAL_COMMUNITY): Payer: Self-pay

## 2021-11-03 DIAGNOSIS — H5213 Myopia, bilateral: Secondary | ICD-10-CM | POA: Diagnosis not present

## 2021-11-09 ENCOUNTER — Other Ambulatory Visit (HOSPITAL_COMMUNITY): Payer: Self-pay

## 2021-11-10 ENCOUNTER — Other Ambulatory Visit (HOSPITAL_COMMUNITY): Payer: Self-pay

## 2021-11-10 MED ORDER — ALBUTEROL SULFATE HFA 108 (90 BASE) MCG/ACT IN AERS
2.0000 | INHALATION_SPRAY | RESPIRATORY_TRACT | 0 refills | Status: DC | PRN
  Filled 2021-11-10: qty 18, 16d supply, fill #0

## 2021-11-16 ENCOUNTER — Other Ambulatory Visit (HOSPITAL_COMMUNITY): Payer: Self-pay

## 2021-11-16 DIAGNOSIS — N898 Other specified noninflammatory disorders of vagina: Secondary | ICD-10-CM | POA: Insufficient documentation

## 2021-11-16 MED ORDER — NYSTATIN 100000 UNIT/GM EX OINT
TOPICAL_OINTMENT | CUTANEOUS | 1 refills | Status: DC
  Filled 2021-11-16: qty 15, 10d supply, fill #0

## 2021-11-16 MED ORDER — FLUCONAZOLE 150 MG PO TABS
ORAL_TABLET | ORAL | 0 refills | Status: DC
  Filled 2021-11-16: qty 3, 7d supply, fill #0

## 2021-11-16 MED ORDER — TRIAMCINOLONE ACETONIDE 0.1 % EX OINT
TOPICAL_OINTMENT | CUTANEOUS | 1 refills | Status: DC
Start: 2021-11-16 — End: 2022-06-28
  Filled 2021-11-16: qty 15, 10d supply, fill #0

## 2021-11-17 ENCOUNTER — Other Ambulatory Visit (HOSPITAL_COMMUNITY): Payer: Self-pay

## 2021-11-18 ENCOUNTER — Other Ambulatory Visit (HOSPITAL_COMMUNITY): Payer: Self-pay

## 2021-11-18 DIAGNOSIS — N3010 Interstitial cystitis (chronic) without hematuria: Secondary | ICD-10-CM | POA: Diagnosis not present

## 2021-11-18 DIAGNOSIS — F330 Major depressive disorder, recurrent, mild: Secondary | ICD-10-CM | POA: Diagnosis not present

## 2021-11-18 DIAGNOSIS — E039 Hypothyroidism, unspecified: Secondary | ICD-10-CM | POA: Diagnosis not present

## 2021-11-18 DIAGNOSIS — M2550 Pain in unspecified joint: Secondary | ICD-10-CM | POA: Diagnosis not present

## 2021-11-18 DIAGNOSIS — G43909 Migraine, unspecified, not intractable, without status migrainosus: Secondary | ICD-10-CM | POA: Diagnosis not present

## 2021-11-18 DIAGNOSIS — E7800 Pure hypercholesterolemia, unspecified: Secondary | ICD-10-CM | POA: Diagnosis not present

## 2021-11-18 DIAGNOSIS — N200 Calculus of kidney: Secondary | ICD-10-CM | POA: Diagnosis not present

## 2021-11-18 DIAGNOSIS — F419 Anxiety disorder, unspecified: Secondary | ICD-10-CM | POA: Diagnosis not present

## 2021-11-18 DIAGNOSIS — Z0000 Encounter for general adult medical examination without abnormal findings: Secondary | ICD-10-CM | POA: Diagnosis not present

## 2021-11-18 MED ORDER — PROMETHAZINE HCL 25 MG PO TABS
ORAL_TABLET | ORAL | 3 refills | Status: AC
  Filled 2021-11-18: qty 30, 7d supply, fill #0
  Filled 2022-07-29: qty 30, 7d supply, fill #1

## 2021-11-18 MED ORDER — BUPROPION HCL ER (XL) 150 MG PO TB24
ORAL_TABLET | ORAL | 3 refills | Status: DC
  Filled 2021-11-18: qty 270, 90d supply, fill #0

## 2021-11-23 ENCOUNTER — Other Ambulatory Visit (HOSPITAL_COMMUNITY): Payer: Self-pay

## 2021-11-24 ENCOUNTER — Other Ambulatory Visit (HOSPITAL_COMMUNITY): Payer: Self-pay

## 2021-12-04 ENCOUNTER — Telehealth: Payer: 59 | Admitting: Nurse Practitioner

## 2021-12-04 DIAGNOSIS — W57XXXD Bitten or stung by nonvenomous insect and other nonvenomous arthropods, subsequent encounter: Secondary | ICD-10-CM

## 2021-12-04 DIAGNOSIS — S50862D Insect bite (nonvenomous) of left forearm, subsequent encounter: Secondary | ICD-10-CM

## 2021-12-04 MED ORDER — DOXYCYCLINE HYCLATE 100 MG PO TABS: 200.0000 mg | ORAL_TABLET | Freq: Once | ORAL | 0 refills | Status: AC

## 2021-12-06 ENCOUNTER — Ambulatory Visit: Payer: 59 | Admitting: Internal Medicine

## 2021-12-06 ENCOUNTER — Telehealth: Payer: Self-pay | Admitting: Internal Medicine

## 2021-12-06 DIAGNOSIS — N63 Unspecified lump in unspecified breast: Secondary | ICD-10-CM | POA: Insufficient documentation

## 2021-12-07 ENCOUNTER — Other Ambulatory Visit (HOSPITAL_COMMUNITY): Payer: Self-pay

## 2021-12-07 DIAGNOSIS — S60862A Insect bite (nonvenomous) of left wrist, initial encounter: Secondary | ICD-10-CM | POA: Diagnosis not present

## 2021-12-07 DIAGNOSIS — W57XXXA Bitten or stung by nonvenomous insect and other nonvenomous arthropods, initial encounter: Secondary | ICD-10-CM | POA: Diagnosis not present

## 2021-12-07 MED ORDER — DOXYCYCLINE HYCLATE 100 MG PO CAPS
100.0000 mg | ORAL_CAPSULE | Freq: Two times a day (BID) | ORAL | 0 refills | Status: DC
  Filled 2021-12-07: qty 14, 7d supply, fill #0

## 2021-12-08 ENCOUNTER — Other Ambulatory Visit (HOSPITAL_COMMUNITY): Payer: Self-pay

## 2021-12-10 ENCOUNTER — Other Ambulatory Visit (HOSPITAL_COMMUNITY): Payer: Self-pay

## 2021-12-13 ENCOUNTER — Other Ambulatory Visit (HOSPITAL_COMMUNITY): Payer: Self-pay

## 2021-12-15 ENCOUNTER — Other Ambulatory Visit (HOSPITAL_COMMUNITY): Payer: Self-pay

## 2021-12-15 MED ORDER — HYDROCODONE-ACETAMINOPHEN 7.5-325 MG PO TABS
1.0000 | ORAL_TABLET | Freq: Four times a day (QID) | ORAL | 0 refills | Status: DC | PRN
  Filled 2021-12-15: qty 120, 30d supply, fill #0

## 2021-12-16 ENCOUNTER — Other Ambulatory Visit (HOSPITAL_COMMUNITY): Payer: Self-pay

## 2021-12-17 ENCOUNTER — Other Ambulatory Visit (HOSPITAL_COMMUNITY): Payer: Self-pay

## 2021-12-17 MED ORDER — HYDROCODONE-ACETAMINOPHEN 7.5-325 MG PO TABS
ORAL_TABLET | ORAL | 0 refills | Status: DC
  Filled 2021-12-17 – 2022-01-14 (×2): qty 120, 30d supply, fill #0

## 2021-12-28 DIAGNOSIS — R399 Unspecified symptoms and signs involving the genitourinary system: Secondary | ICD-10-CM | POA: Diagnosis not present

## 2022-01-06 ENCOUNTER — Other Ambulatory Visit (HOSPITAL_COMMUNITY): Payer: Self-pay

## 2022-01-06 MED ORDER — MONTELUKAST SODIUM 10 MG PO TABS
ORAL_TABLET | ORAL | 3 refills | Status: AC
  Filled 2022-01-06: qty 90, 90d supply, fill #0
  Filled 2022-04-12: qty 90, 90d supply, fill #1
  Filled 2022-07-29: qty 90, 90d supply, fill #2

## 2022-01-14 ENCOUNTER — Other Ambulatory Visit (HOSPITAL_COMMUNITY): Payer: Self-pay

## 2022-01-17 ENCOUNTER — Other Ambulatory Visit (HOSPITAL_COMMUNITY): Payer: Self-pay

## 2022-01-18 ENCOUNTER — Other Ambulatory Visit (HOSPITAL_COMMUNITY): Payer: Self-pay

## 2022-01-19 ENCOUNTER — Other Ambulatory Visit (HOSPITAL_COMMUNITY): Payer: Self-pay

## 2022-01-24 ENCOUNTER — Other Ambulatory Visit (HOSPITAL_COMMUNITY): Payer: Self-pay

## 2022-01-25 ENCOUNTER — Other Ambulatory Visit (HOSPITAL_COMMUNITY): Payer: Self-pay

## 2022-01-25 DIAGNOSIS — R110 Nausea: Secondary | ICD-10-CM | POA: Diagnosis not present

## 2022-01-25 DIAGNOSIS — N200 Calculus of kidney: Secondary | ICD-10-CM | POA: Diagnosis not present

## 2022-01-25 DIAGNOSIS — R102 Pelvic and perineal pain: Secondary | ICD-10-CM | POA: Diagnosis not present

## 2022-01-25 DIAGNOSIS — N9489 Other specified conditions associated with female genital organs and menstrual cycle: Secondary | ICD-10-CM | POA: Diagnosis not present

## 2022-01-25 DIAGNOSIS — N3010 Interstitial cystitis (chronic) without hematuria: Secondary | ICD-10-CM | POA: Diagnosis not present

## 2022-01-25 MED ORDER — NITROFURANTOIN MACROCRYSTAL 50 MG PO CAPS
ORAL_CAPSULE | ORAL | 99 refills | Status: DC
  Filled 2022-01-25: qty 30, 30d supply, fill #0

## 2022-01-25 MED ORDER — ME/NAPHOS/MB/HYO1 81.6 MG PO TABS
ORAL_TABLET | ORAL | 99 refills | Status: DC
  Filled 2022-01-25: qty 100, 25d supply, fill #0

## 2022-01-25 MED ORDER — HYDROCODONE-ACETAMINOPHEN 7.5-325 MG PO TABS
1.0000 | ORAL_TABLET | Freq: Four times a day (QID) | ORAL | 0 refills | Status: DC | PRN
  Filled 2022-03-19: qty 120, 30d supply, fill #0

## 2022-01-25 MED ORDER — POTASSIUM CITRATE ER 15 MEQ (1620 MG) PO TBCR
EXTENDED_RELEASE_TABLET | ORAL | 11 refills | Status: DC
Start: 2022-01-25 — End: 2022-07-28
  Filled 2022-01-25: qty 60, 30d supply, fill #0

## 2022-01-25 MED ORDER — TAMSULOSIN HCL 0.4 MG PO CAPS
ORAL_CAPSULE | ORAL | 99 refills | Status: DC
  Filled 2022-01-25: qty 30, 30d supply, fill #0

## 2022-01-25 MED ORDER — HYDROCODONE-ACETAMINOPHEN 7.5-325 MG PO TABS
1.0000 | ORAL_TABLET | Freq: Four times a day (QID) | ORAL | 0 refills | Status: DC | PRN
  Filled 2022-04-20: qty 120, 30d supply, fill #0

## 2022-01-25 MED ORDER — ONDANSETRON HCL 8 MG PO TABS
8.0000 mg | ORAL_TABLET | Freq: Three times a day (TID) | ORAL | 11 refills | Status: AC | PRN
  Filled 2022-01-25: qty 30, 10d supply, fill #0
  Filled 2022-03-25: qty 30, 10d supply, fill #1
  Filled 2022-05-03: qty 30, 10d supply, fill #2
  Filled 2022-06-01: qty 30, 10d supply, fill #3
  Filled 2022-06-30: qty 30, 10d supply, fill #4
  Filled 2022-07-29: qty 30, 10d supply, fill #5
  Filled 2022-09-02: qty 30, 10d supply, fill #6
  Filled 2022-10-19: qty 30, 10d supply, fill #7

## 2022-01-25 MED ORDER — ELMIRON 100 MG PO CAPS
ORAL_CAPSULE | ORAL | 11 refills | Status: DC
Start: 2022-01-25 — End: 2022-07-28
  Filled 2022-01-25 – 2022-02-09 (×2): qty 60, 30d supply, fill #0

## 2022-01-25 MED ORDER — MONTELUKAST SODIUM 10 MG PO TABS
ORAL_TABLET | ORAL | 99 refills | Status: DC
  Filled 2022-01-25: qty 30, 30d supply, fill #0

## 2022-01-25 MED ORDER — DIAZEPAM 10 MG PO TABS
ORAL_TABLET | ORAL | 5 refills | Status: DC
  Filled 2022-01-25 – 2022-02-10 (×2): qty 30, 30d supply, fill #0
  Filled 2022-03-25: qty 30, 30d supply, fill #1
  Filled 2022-06-01: qty 30, 30d supply, fill #2

## 2022-01-25 MED ORDER — HYDROCODONE-ACETAMINOPHEN 7.5-325 MG PO TABS
ORAL_TABLET | ORAL | 0 refills | Status: DC
  Filled 2022-02-17: qty 120, 30d supply, fill #0

## 2022-01-25 MED ORDER — HYDROCHLOROTHIAZIDE 50 MG PO TABS
50.0000 mg | ORAL_TABLET | Freq: Every day | ORAL | 11 refills | Status: DC
  Filled 2022-01-25 – 2022-06-03 (×2): qty 30, 30d supply, fill #0
  Filled 2022-07-29: qty 30, 30d supply, fill #1
  Filled 2022-09-02: qty 30, 30d supply, fill #2
  Filled 2022-11-04: qty 30, 30d supply, fill #3
  Filled 2023-01-20: qty 30, 30d supply, fill #4

## 2022-01-25 MED ORDER — OXYBUTYNIN CHLORIDE ER 10 MG PO TB24
10.0000 mg | ORAL_TABLET | Freq: Every day | ORAL | 3 refills | Status: DC
  Filled 2022-01-25: qty 90, 90d supply, fill #0

## 2022-01-26 ENCOUNTER — Other Ambulatory Visit (HOSPITAL_COMMUNITY): Payer: Self-pay

## 2022-01-28 ENCOUNTER — Other Ambulatory Visit (HOSPITAL_COMMUNITY): Payer: Self-pay

## 2022-02-02 ENCOUNTER — Other Ambulatory Visit (HOSPITAL_COMMUNITY): Payer: Self-pay

## 2022-02-09 ENCOUNTER — Other Ambulatory Visit (HOSPITAL_COMMUNITY): Payer: Self-pay

## 2022-02-10 ENCOUNTER — Other Ambulatory Visit (HOSPITAL_COMMUNITY): Payer: Self-pay

## 2022-02-17 ENCOUNTER — Other Ambulatory Visit (HOSPITAL_COMMUNITY): Payer: Self-pay

## 2022-02-22 ENCOUNTER — Other Ambulatory Visit (HOSPITAL_COMMUNITY): Payer: Self-pay

## 2022-02-23 ENCOUNTER — Other Ambulatory Visit (HOSPITAL_COMMUNITY): Payer: Self-pay

## 2022-03-14 ENCOUNTER — Other Ambulatory Visit (HOSPITAL_COMMUNITY): Payer: Self-pay

## 2022-03-14 ENCOUNTER — Telehealth: Payer: 59 | Admitting: Physician Assistant

## 2022-03-14 DIAGNOSIS — R3989 Other symptoms and signs involving the genitourinary system: Secondary | ICD-10-CM

## 2022-03-14 MED ORDER — CEPHALEXIN 500 MG PO CAPS
500.0000 mg | ORAL_CAPSULE | Freq: Two times a day (BID) | ORAL | 0 refills | Status: AC
  Filled 2022-03-14: qty 14, 7d supply, fill #0

## 2022-03-14 NOTE — Progress Notes (Signed)
I have spent 5 minutes in review of e-visit questionnaire, review and updating patient chart, medical decision making and response to patient.   Ninah Moccio Cody Deyton Ellenbecker, PA-C    

## 2022-03-14 NOTE — Progress Notes (Signed)

## 2022-03-19 ENCOUNTER — Other Ambulatory Visit (HOSPITAL_COMMUNITY): Payer: Self-pay

## 2022-03-25 ENCOUNTER — Other Ambulatory Visit (HOSPITAL_COMMUNITY): Payer: Self-pay

## 2022-03-25 MED ORDER — FLUCONAZOLE 150 MG PO TABS
150.0000 mg | ORAL_TABLET | ORAL | 0 refills | Status: DC
  Filled 2022-03-25: qty 3, 7d supply, fill #0

## 2022-04-01 ENCOUNTER — Other Ambulatory Visit (HOSPITAL_COMMUNITY): Payer: Self-pay

## 2022-04-01 DIAGNOSIS — J3089 Other allergic rhinitis: Secondary | ICD-10-CM | POA: Diagnosis not present

## 2022-04-01 DIAGNOSIS — J4530 Mild persistent asthma, uncomplicated: Secondary | ICD-10-CM | POA: Diagnosis not present

## 2022-04-01 DIAGNOSIS — H1045 Other chronic allergic conjunctivitis: Secondary | ICD-10-CM | POA: Diagnosis not present

## 2022-04-01 DIAGNOSIS — J301 Allergic rhinitis due to pollen: Secondary | ICD-10-CM | POA: Diagnosis not present

## 2022-04-01 MED ORDER — BUDESONIDE-FORMOTEROL FUMARATE 160-4.5 MCG/ACT IN AERO
2.0000 | INHALATION_SPRAY | Freq: Two times a day (BID) | RESPIRATORY_TRACT | 6 refills | Status: DC
  Filled 2022-04-01: qty 10.2, 30d supply, fill #0
  Filled 2022-06-17: qty 10.2, 30d supply, fill #1
  Filled 2022-07-29: qty 10.2, 30d supply, fill #2
  Filled 2022-09-23: qty 10.2, 30d supply, fill #3
  Filled 2022-11-09: qty 10.2, 30d supply, fill #4
  Filled 2022-12-13: qty 10.2, 30d supply, fill #5
  Filled 2023-01-20: qty 10.2, 30d supply, fill #6

## 2022-04-01 MED ORDER — ALBUTEROL SULFATE HFA 108 (90 BASE) MCG/ACT IN AERS
1.0000 | INHALATION_SPRAY | RESPIRATORY_TRACT | 0 refills | Status: DC | PRN
  Filled 2022-04-01: qty 20.1, 90d supply, fill #0

## 2022-04-02 ENCOUNTER — Other Ambulatory Visit (HOSPITAL_COMMUNITY): Payer: Self-pay

## 2022-04-12 ENCOUNTER — Other Ambulatory Visit (HOSPITAL_COMMUNITY): Payer: Self-pay

## 2022-04-12 DIAGNOSIS — R87610 Atypical squamous cells of undetermined significance on cytologic smear of cervix (ASC-US): Secondary | ICD-10-CM | POA: Diagnosis not present

## 2022-04-12 DIAGNOSIS — Z6831 Body mass index (BMI) 31.0-31.9, adult: Secondary | ICD-10-CM | POA: Diagnosis not present

## 2022-04-12 DIAGNOSIS — Z01419 Encounter for gynecological examination (general) (routine) without abnormal findings: Secondary | ICD-10-CM | POA: Diagnosis not present

## 2022-04-14 ENCOUNTER — Other Ambulatory Visit (HOSPITAL_COMMUNITY): Payer: Self-pay

## 2022-04-20 ENCOUNTER — Other Ambulatory Visit (HOSPITAL_COMMUNITY): Payer: Self-pay

## 2022-04-29 ENCOUNTER — Other Ambulatory Visit (HOSPITAL_COMMUNITY): Payer: Self-pay

## 2022-04-29 DIAGNOSIS — J4530 Mild persistent asthma, uncomplicated: Secondary | ICD-10-CM | POA: Diagnosis not present

## 2022-04-29 DIAGNOSIS — H1045 Other chronic allergic conjunctivitis: Secondary | ICD-10-CM | POA: Diagnosis not present

## 2022-04-29 DIAGNOSIS — J209 Acute bronchitis, unspecified: Secondary | ICD-10-CM | POA: Diagnosis not present

## 2022-04-29 DIAGNOSIS — J301 Allergic rhinitis due to pollen: Secondary | ICD-10-CM | POA: Diagnosis not present

## 2022-04-29 DIAGNOSIS — J3089 Other allergic rhinitis: Secondary | ICD-10-CM | POA: Diagnosis not present

## 2022-04-29 MED ORDER — ALBUTEROL SULFATE (2.5 MG/3ML) 0.083% IN NEBU
INHALATION_SOLUTION | RESPIRATORY_TRACT | 1 refills | Status: AC
  Filled 2022-04-29: qty 90, 5d supply, fill #0
  Filled 2023-01-20: qty 90, 5d supply, fill #1

## 2022-04-29 MED ORDER — AZITHROMYCIN 250 MG PO TABS
ORAL_TABLET | ORAL | 0 refills | Status: DC
  Filled 2022-04-29: qty 6, 5d supply, fill #0

## 2022-04-29 MED ORDER — AZELASTINE HCL 0.05 % OP SOLN
1.0000 [drp] | Freq: Two times a day (BID) | OPHTHALMIC | 3 refills | Status: DC
  Filled 2022-04-29: qty 6, 30d supply, fill #0

## 2022-04-29 MED ORDER — PREDNISONE 10 MG (21) PO TBPK
ORAL_TABLET | ORAL | 0 refills | Status: DC
  Filled 2022-04-29: qty 21, 6d supply, fill #0

## 2022-05-02 ENCOUNTER — Other Ambulatory Visit (HOSPITAL_COMMUNITY): Payer: Self-pay

## 2022-05-02 DIAGNOSIS — N9489 Other specified conditions associated with female genital organs and menstrual cycle: Secondary | ICD-10-CM | POA: Diagnosis not present

## 2022-05-02 DIAGNOSIS — R102 Pelvic and perineal pain: Secondary | ICD-10-CM | POA: Diagnosis not present

## 2022-05-02 DIAGNOSIS — R3915 Urgency of urination: Secondary | ICD-10-CM | POA: Diagnosis not present

## 2022-05-02 DIAGNOSIS — R350 Frequency of micturition: Secondary | ICD-10-CM | POA: Diagnosis not present

## 2022-05-02 DIAGNOSIS — N3010 Interstitial cystitis (chronic) without hematuria: Secondary | ICD-10-CM | POA: Diagnosis not present

## 2022-05-02 MED ORDER — HYDROCODONE-ACETAMINOPHEN 7.5-325 MG PO TABS
1.0000 | ORAL_TABLET | Freq: Four times a day (QID) | ORAL | 0 refills | Status: DC | PRN
  Filled 2022-05-02 – 2022-05-19 (×2): qty 120, 30d supply, fill #0

## 2022-05-02 MED ORDER — HYDROCODONE-ACETAMINOPHEN 7.5-325 MG PO TABS
1.0000 | ORAL_TABLET | Freq: Four times a day (QID) | ORAL | 0 refills | Status: AC | PRN
  Filled 2022-06-17: qty 120, 30d supply, fill #0

## 2022-05-02 MED ORDER — HYDROCODONE-ACETAMINOPHEN 7.5-325 MG PO TABS
1.0000 | ORAL_TABLET | Freq: Four times a day (QID) | ORAL | 0 refills | Status: DC | PRN
  Filled 2022-06-17 – 2022-07-15 (×2): qty 120, 30d supply, fill #0

## 2022-05-03 ENCOUNTER — Other Ambulatory Visit (HOSPITAL_COMMUNITY): Payer: Self-pay

## 2022-05-04 ENCOUNTER — Other Ambulatory Visit (HOSPITAL_COMMUNITY): Payer: Self-pay

## 2022-05-04 MED ORDER — HYDROCHLOROTHIAZIDE 50 MG PO TABS
50.0000 mg | ORAL_TABLET | Freq: Every day | ORAL | 2 refills | Status: DC
  Filled 2022-05-04: qty 30, 30d supply, fill #0

## 2022-05-10 ENCOUNTER — Other Ambulatory Visit (HOSPITAL_COMMUNITY): Payer: Self-pay

## 2022-05-10 MED ORDER — NYSTATIN 100000 UNIT/ML MT SUSP
OROMUCOSAL | 0 refills | Status: DC
  Filled 2022-05-10: qty 250, 14d supply, fill #0

## 2022-05-10 MED ORDER — LIDOCAINE VISCOUS HCL 2 % MT SOLN
OROMUCOSAL | 0 refills | Status: DC
  Filled 2022-05-10: qty 250, 8d supply, fill #0

## 2022-05-11 ENCOUNTER — Other Ambulatory Visit (HOSPITAL_COMMUNITY): Payer: Self-pay

## 2022-05-17 ENCOUNTER — Other Ambulatory Visit (HOSPITAL_COMMUNITY): Payer: Self-pay

## 2022-05-17 MED ORDER — CLOTRIMAZOLE 10 MG MT TROC
10.0000 mg | Freq: Three times a day (TID) | OROMUCOSAL | 0 refills | Status: DC
  Filled 2022-05-17: qty 30, 10d supply, fill #0

## 2022-05-19 ENCOUNTER — Other Ambulatory Visit (HOSPITAL_COMMUNITY): Payer: Self-pay

## 2022-05-20 ENCOUNTER — Other Ambulatory Visit (HOSPITAL_COMMUNITY): Payer: Self-pay

## 2022-05-25 ENCOUNTER — Telehealth: Payer: 59 | Admitting: Emergency Medicine

## 2022-05-25 ENCOUNTER — Other Ambulatory Visit (HOSPITAL_COMMUNITY): Payer: Self-pay

## 2022-05-25 DIAGNOSIS — J029 Acute pharyngitis, unspecified: Secondary | ICD-10-CM

## 2022-05-25 MED ORDER — AMOXICILLIN 500 MG PO CAPS
500.0000 mg | ORAL_CAPSULE | Freq: Two times a day (BID) | ORAL | 0 refills | Status: AC
  Filled 2022-05-25: qty 20, 10d supply, fill #0

## 2022-05-25 NOTE — Progress Notes (Signed)
E-Visit for Sore Throat - Strep Symptoms  We are sorry that you are not feeling well.  Here is how we plan to help!  Based on what you have shared with me it is likely that you have strep pharyngitis.  Strep pharyngitis is inflammation and infection in the back of the throat.  This is an infection cause by bacteria and is treated with antibiotics.  I have prescribed Amoxicillin 500 mg twice a day for 10 days. For throat pain, we recommend over the counter oral pain relief medications such as acetaminophen or aspirin, or anti-inflammatory medications such as ibuprofen or naproxen sodium. Topical treatments such as oral throat lozenges or sprays may be used as needed. Strep infections are not as easily transmitted as other respiratory infections, however we still recommend that you avoid close contact with loved ones, especially the very young and elderly.  Remember to wash your hands thoroughly throughout the day as this is the number one way to prevent the spread of infection and wipe down door knobs and counters with disinfectant.   Home Care: Only take medications as instructed by your medical team. Complete the entire course of an antibiotic. Do not take these medications with alcohol. A steam or ultrasonic humidifier can help congestion.  You can place a towel over your head and breathe in the steam from hot water coming from a faucet. Avoid close contacts especially the very young and the elderly. Cover your mouth when you cough or sneeze. Always remember to wash your hands.  Get Help Right Away If: You develop worsening fever or sinus pain. You develop a severe head ache or visual changes. Your symptoms persist after you have completed your treatment plan.  Make sure you Understand these instructions. Will watch your condition. Will get help right away if you are not doing well or get worse.   Thank you for choosing an e-visit.  Your e-visit answers were reviewed by a board  certified advanced clinical practitioner to complete your personal care plan. Depending upon the condition, your plan could have included both over the counter or prescription medications.  Please review your pharmacy choice. Make sure the pharmacy is open so you can pick up prescription now. If there is a problem, you may contact your provider through [employer] and have the prescription routed to another pharmacy.  Your safety is important to [place]. If you have drug allergies check your prescription carefully.   For the next 24 hours you can use MyChart to ask questions about today's visit, request a non-urgent call back, or ask for a work or school excuse. You will get an email in the next two days asking about your experience. I hope that your e-visit has been valuable and will speed your recovery.  Approximately 5 minutes was used in reviewing the patient's chart, questionnaire, prescribing medications, and documentation.

## 2022-06-01 ENCOUNTER — Other Ambulatory Visit (HOSPITAL_COMMUNITY): Payer: Self-pay

## 2022-06-02 ENCOUNTER — Other Ambulatory Visit (HOSPITAL_COMMUNITY): Payer: Self-pay

## 2022-06-03 ENCOUNTER — Other Ambulatory Visit (HOSPITAL_COMMUNITY): Payer: Self-pay

## 2022-06-03 ENCOUNTER — Other Ambulatory Visit: Payer: Self-pay

## 2022-06-03 MED ORDER — ALBUTEROL SULFATE HFA 108 (90 BASE) MCG/ACT IN AERS
2.0000 | INHALATION_SPRAY | RESPIRATORY_TRACT | 0 refills | Status: DC | PRN
  Filled 2022-06-03: qty 18, 16d supply, fill #0
  Filled 2022-06-17: qty 6.7, 17d supply, fill #0

## 2022-06-03 MED ORDER — LEVOTHYROXINE SODIUM 125 MCG PO TABS
ORAL_TABLET | ORAL | 2 refills | Status: AC
  Filled 2022-06-03: qty 90, 90d supply, fill #0
  Filled 2022-11-18: qty 90, 90d supply, fill #1

## 2022-06-12 IMAGING — US US ABDOMEN LIMITED
1 series · 15 of 25 positions shown · non-contrast
Comparison: CT scan of the abdomen and pelvis June 28, 2017.

CLINICAL DATA: Gallstones.

EXAM:
ULTRASOUND ABDOMEN LIMITED RIGHT UPPER QUADRANT

[Series 1: us abdomen limited ruq mc & wl · 15 of 43 slices shown]
[im 1/43]
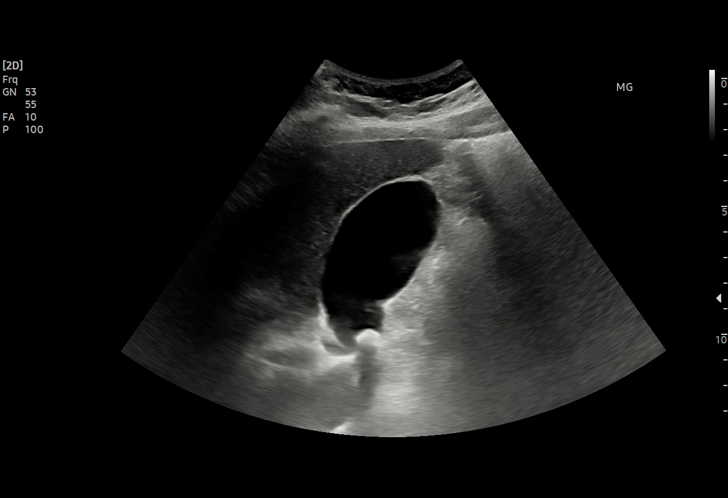
[im 4/43]
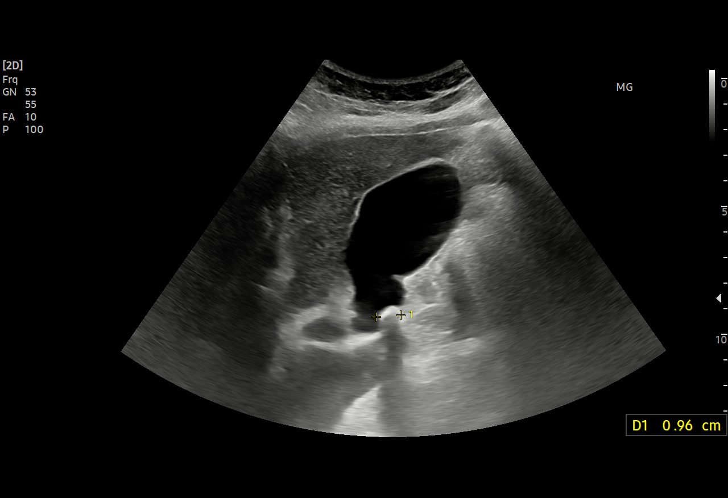
[im 8/43]
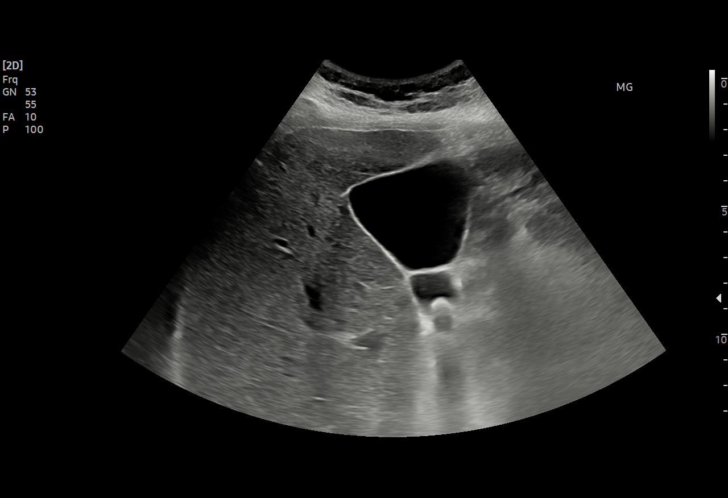
[im 9/43]
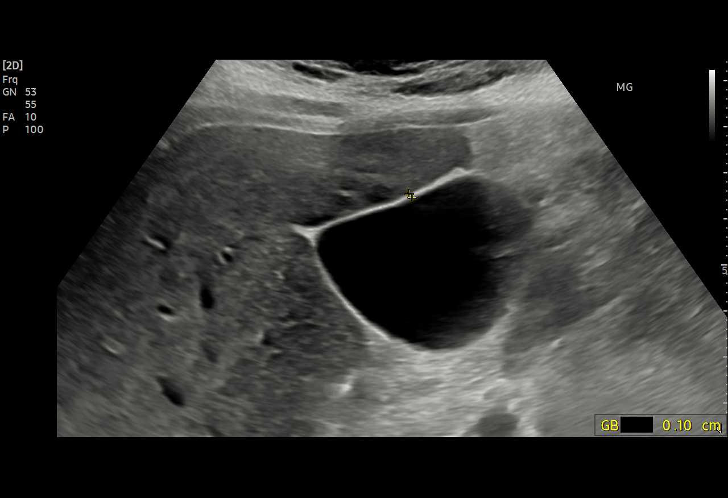
[im 13/43]
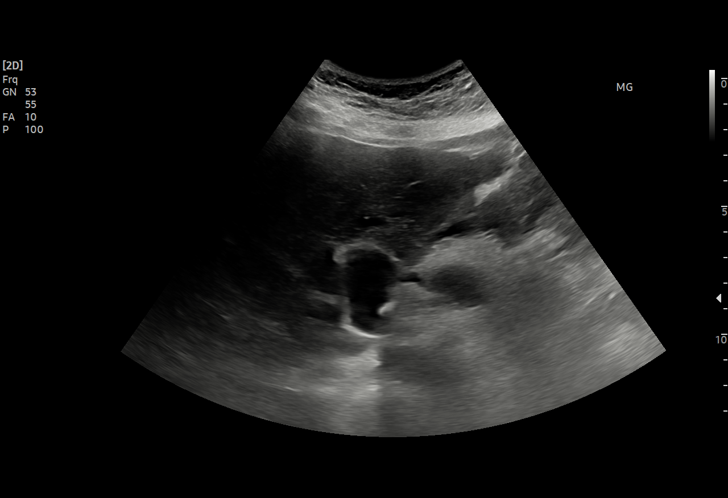
[im 16/43]
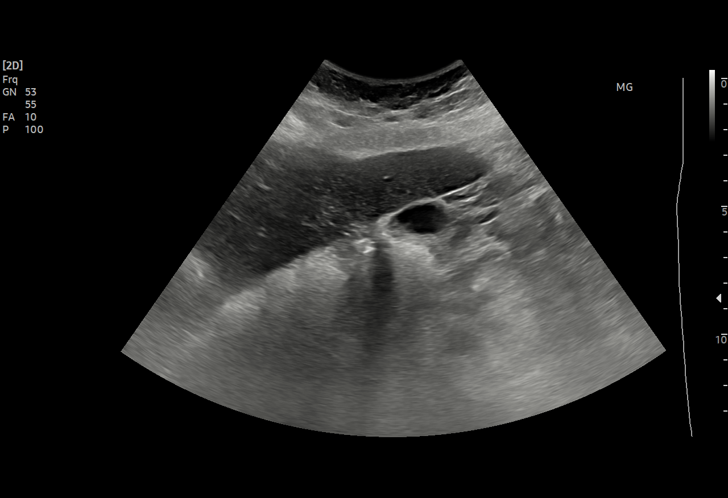
[im 18/43]
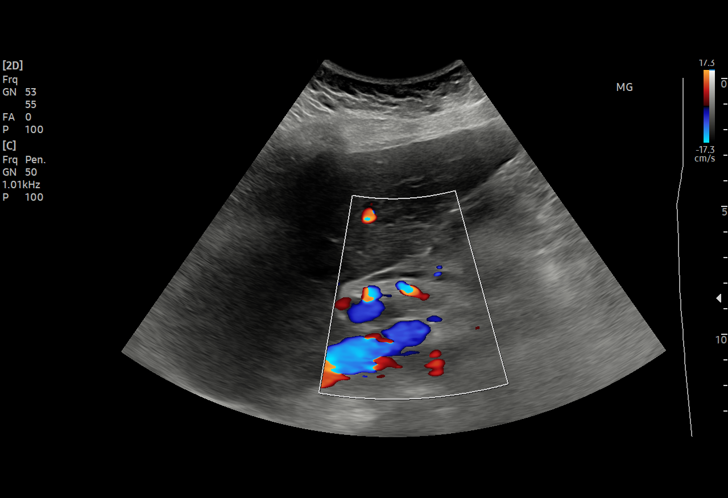
[im 22/43]
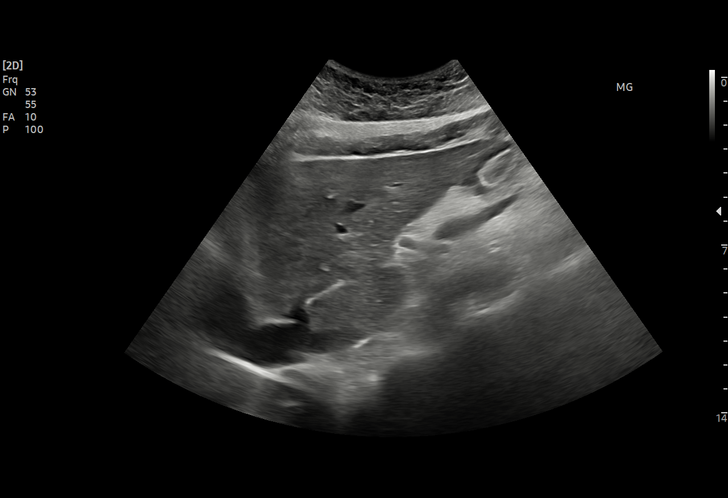
[im 25/43]
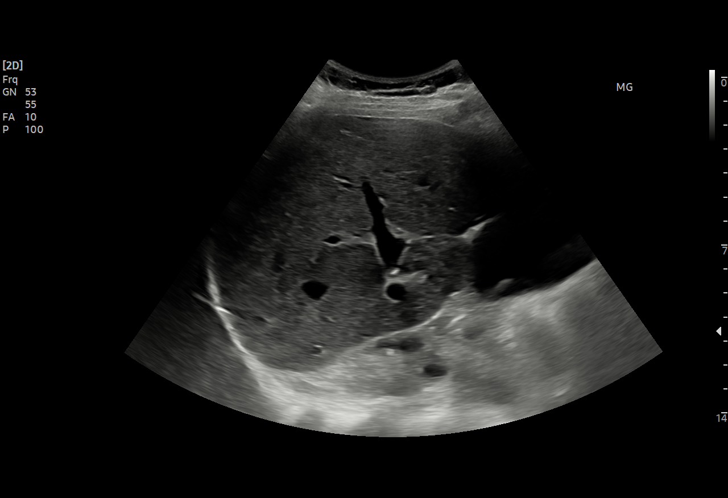
[im 27/43]
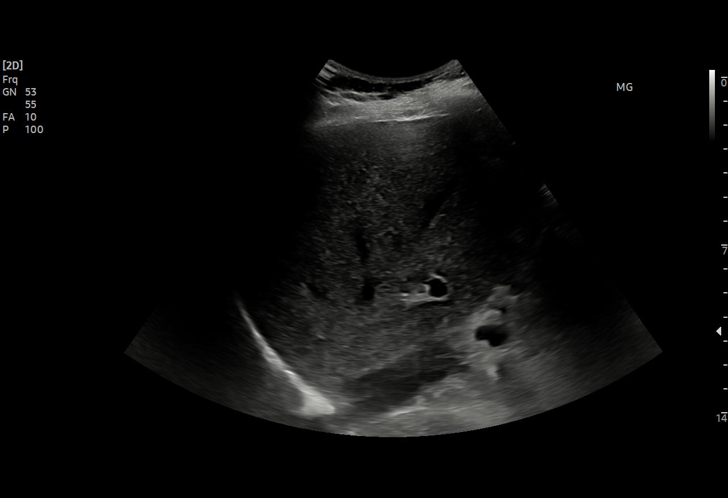
[im 30/43]
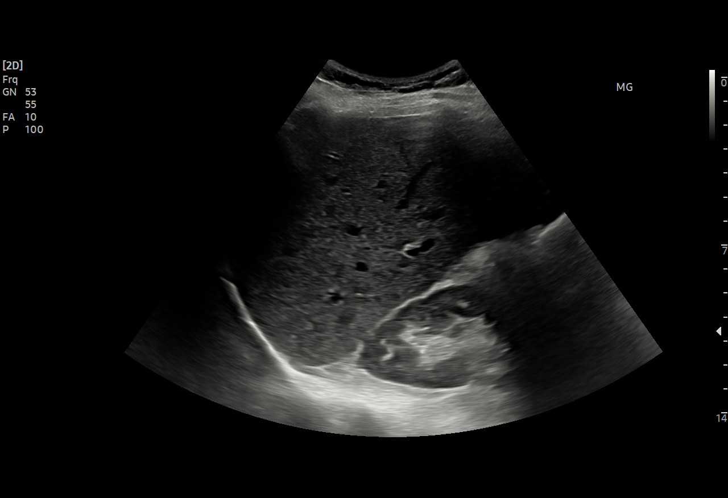
[im 34/43]
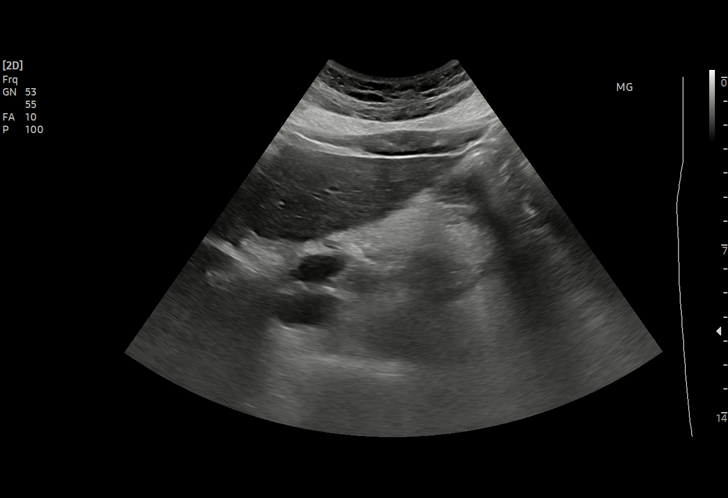
[im 36/43]
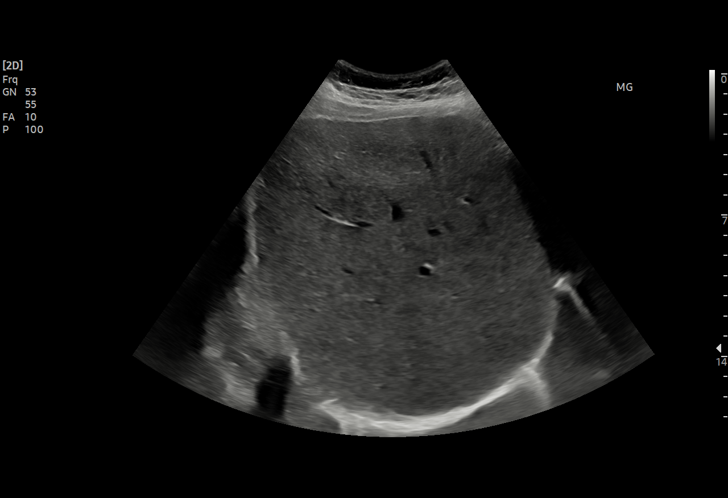
[im 39/43]
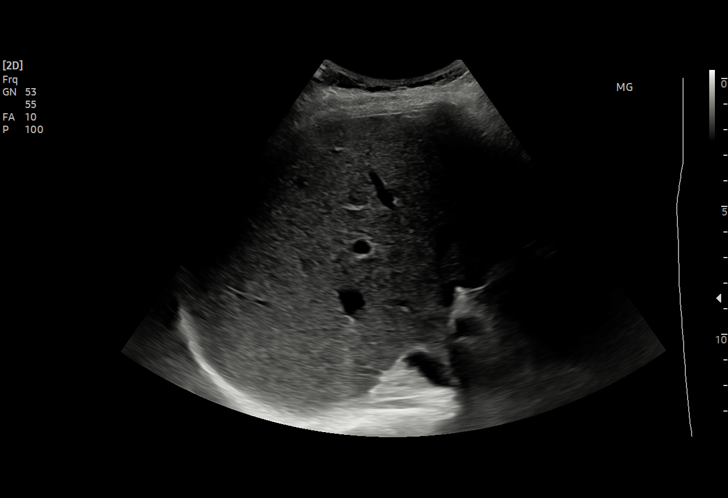
[im 43/43]
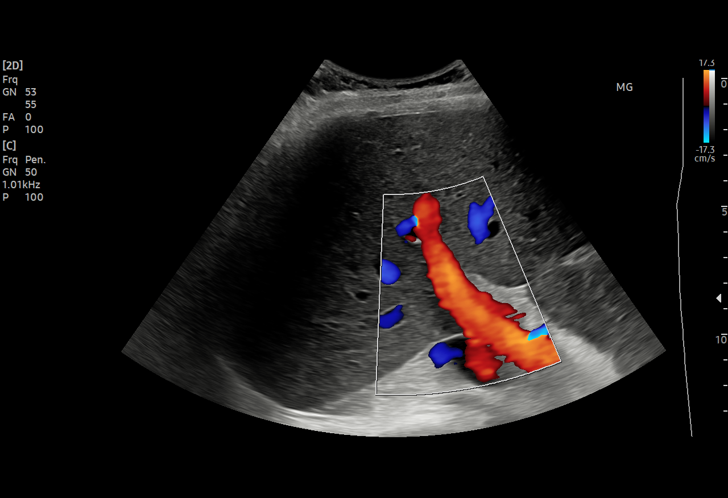

[15 of 25 positions shown; findings below may reference images not displayed]

FINDINGS: Gallbladder:

There is a single 9.6 mm stone in the gallbladder. No wall
thickening, pericholecystic fluid, or Murphy's sign.

Common bile duct:

Diameter: 4.5 mm

Liver:

No focal lesion identified. Within normal limits in parenchymal
echogenicity. Portal vein is patent on color Doppler imaging with
normal direction of blood flow towards the liver.

Other: None.
IMPRESSION: 1. There is a single 9.6 mm stone in the gallbladder. The
gallbladder is otherwise normal.
2. The common bile duct and liver are unremarkable.

## 2022-06-16 ENCOUNTER — Other Ambulatory Visit (HOSPITAL_COMMUNITY): Payer: Self-pay

## 2022-06-17 ENCOUNTER — Other Ambulatory Visit (HOSPITAL_COMMUNITY): Payer: Self-pay

## 2022-06-17 ENCOUNTER — Other Ambulatory Visit: Payer: Self-pay

## 2022-06-20 ENCOUNTER — Other Ambulatory Visit (HOSPITAL_COMMUNITY): Payer: Self-pay

## 2022-06-21 ENCOUNTER — Other Ambulatory Visit (HOSPITAL_COMMUNITY): Payer: Self-pay

## 2022-06-27 ENCOUNTER — Telehealth: Payer: Self-pay | Admitting: Internal Medicine

## 2022-06-27 NOTE — Telephone Encounter (Signed)
Inbound call from patient stating he is experiencing right upper quadrant pain. Patient is scheduled for ov with provider in February. Would like to speak with a nurse as well.   Please advise.  Thank you

## 2022-06-27 NOTE — Telephone Encounter (Signed)
Pt stated that she started experiencing RUQ pain on Saturday that is continuing along with nausea. Pt was scheduled for an office visit on 06/28/2022 at 10:30 AM with Alonza Bogus PA: Pt made aware Pt verbalized understanding with all questions answered.

## 2022-06-28 ENCOUNTER — Encounter: Payer: Self-pay | Admitting: Gastroenterology

## 2022-06-28 ENCOUNTER — Ambulatory Visit: Payer: Commercial Managed Care - PPO | Admitting: Gastroenterology

## 2022-06-28 ENCOUNTER — Other Ambulatory Visit (HOSPITAL_COMMUNITY): Payer: Self-pay

## 2022-06-28 VITALS — BP 118/82 | HR 63 | Ht 64.0 in | Wt 189.0 lb

## 2022-06-28 DIAGNOSIS — K8020 Calculus of gallbladder without cholecystitis without obstruction: Secondary | ICD-10-CM | POA: Insufficient documentation

## 2022-06-28 DIAGNOSIS — R1011 Right upper quadrant pain: Secondary | ICD-10-CM

## 2022-06-28 DIAGNOSIS — R110 Nausea: Secondary | ICD-10-CM | POA: Diagnosis not present

## 2022-06-28 MED ORDER — METOCLOPRAMIDE HCL 5 MG PO TABS
5.0000 mg | ORAL_TABLET | Freq: Three times a day (TID) | ORAL | 0 refills | Status: AC | PRN
  Filled 2022-06-28: qty 20, 7d supply, fill #0

## 2022-06-28 NOTE — Progress Notes (Signed)
06/28/2022 [name] [id number] 17-Mar-1976   HISTORY OF PRESENT ILLNESS:  This is a 47 year old female who is a patient of Dr. Celesta Aver.  She is here today for ongoing complaints of right upper quadrant abdominal pain and nausea.  The symptoms have been present for at least close to a year and worsening.  Ultrasound last year had a 9.6 mm gallstone but was otherwise unremarkable.  An EGD and colonoscopy last year unremarkable for cause of pain.  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  She says that her symptoms are a lot more frequent.  Symptoms definitely come and go.  She has been using Zofran and Tums or whatever else she can do to battle the nausea.  Pain is described as sharp in the right upper quadrant.  She is on Nexium 40 mg daily for reflux symptoms.  Past Medical History:  Diagnosis Date   Asthma    Bronchitis    Chronic interstitial cystitis    GERD (gastroesophageal reflux disease)    History of migraine headaches    Hyperlipidemia    Interstitial cystitis    Rhinosinusitis    Swollen lymph nodes    right side neck   Past Surgical History:  Procedure Laterality Date   BREAST BIOPSY  10/15/2021   CESAREAN SECTION  4098,1191   x2   COLONOSCOPY     DILATION AND CURETTAGE OF UTERUS  06/13/2001   TONSILECTOMY, ADENOIDECTOMY, BILATERAL MYRINGOTOMY AND TUBES     UPPER GASTROINTESTINAL ENDOSCOPY  05/11/2005; 12/21/2010   2006 Normal; 2012 erosive gastritis    reports that she has never smoked. She has never used smokeless tobacco. She reports that she does not drink alcohol and does not use drugs. family history includes Allergies in her unknown relative; Asthma in her maternal grandfather; Breast cancer in her maternal grandmother; COPD in her unknown relative; Heart disease in her maternal grandmother; Lymphoma in her maternal grandmother. Allergies  Allergen Reactions   Latex Rash and Other (See Comments)     wheezing   Pork-Derived Products       Outpatient Encounter Medications as of 06/28/2022  Medication Sig   albuterol (PROVENTIL) (2.5 MG/3ML) 0.083% nebulizer solution Inhale 1 vial via nebulizer every 4-6 hours hours as needed for cough/wheeze   albuterol (VENTOLIN HFA) 108 (90 Base) MCG/ACT inhaler Inhale 1-2 puffs into the lungs every 4 (four) hours as needed.   azelastine (ASTELIN) 0.1 % nasal spray Use 1-2 sprays in each nostril 2 times a day   azelastine (OPTIVAR) 0.05 % ophthalmic solution Place 1 drop into affected eye 2 times a day   budesonide-formoterol (SYMBICORT) 160-4.5 MCG/ACT inhaler Inhale 2 puffs into the lungs 2 (two) times daily.   bupivacaine,PF, (MARCAINE) 0.5 % SOLN injection Instill 15 MLs into bladder daily - Discard each vial after use.   busPIRone (BUSPAR) 5 MG tablet TAKE 1 TABLET BY MOUTH TWO TIMES DAILY   clotrimazole (MYCELEX) 10 MG troche Place 1 troche in mouth and dissolve 3 times daily.   diazepam (VALIUM) 10 MG tablet Insert 1 tablet vaginally daily   esomeprazole (NEXIUM) 40 MG capsule Take 1 capsule (40 mg total) by mouth daily before breakfast.   fluticasone (FLONASE) 50 MCG/ACT nasal spray Use 1-2 sprays in each nostril daily   hydrochlorothiazide (HYDRODIURIL) 50 MG tablet Take 1 tablet (50 mg total) by mouth daily.   HYDROcodone-acetaminophen (NORCO) 7.5-325 MG tablet Take 1  tablet by mouth every 6 (six) hours as needed for pain   HYDROcodone-acetaminophen (NORCO) 7.5-325 MG tablet Take 1 tablet by mouth every 6 (six) hours as needed. (fill in 30 days)   HYDROcodone-acetaminophen (NORCO) 7.5-325 MG tablet Take 1 tablet by mouth every 6 (six) hours as needed. (fill in 60 days)   hydrOXYzine (ATARAX) 25 MG tablet TAKE 1 TO 3 TABLETS BY MOUTH AT BEDTIME   levalbuterol (XOPENEX) 1.25 MG/3ML nebulizer solution Take 1 ampule by nebulization every 4 (four) hours as needed.     levothyroxine (SYNTHROID) 125 MCG tablet Take 1 tablet by mouth every morning on an  empty stomach   Loratadine 10 MG CAPS Take 1 capsule by mouth daily.     magic mouthwash (nystatin, lidocaine, diphenhydrAMINE, alum & mag hydroxide) suspension Swish and spit 76m 2 to 3 times daily as directed.   Methen-Hyosc-Meth Blue-Na Phos (ME/NAPHOS/MB/HYO1) 81.6 MG TABS Take 1 tablet by mouth every 6 hours as needed for burning   metoCLOPramide (REGLAN) 5 MG tablet Take 1 tablet (5 mg total) by mouth every 8 (eight) hours as needed for nausea.   mometasone (NASONEX) 50 MCG/ACT nasal spray Place 2 sprays into the nose daily.    montelukast (SINGULAIR) 10 MG tablet Take 1 tablet (10 mg total) by mouth nightly.   montelukast (SINGULAIR) 10 MG tablet Take 1 tablet (10 mg total) by mouth nightly.   nystatin ointment (MYCOSTATIN) APPLY TO THE AFFECTED AREA(S) BY TOPICALLY 2 TIMES PER DAY   ondansetron (ZOFRAN) 8 MG tablet TAKE 1 TABLET BY MOUTH EVERY 8 HOURS AS NEEDED FOR NAUSEA.   oxybutynin (DITROPAN-XL) 10 MG 24 hr tablet Take 1 tablet (10 mg total) by mouth daily.   pentosan polysulfate (ELMIRON) 100 MG capsule Use 2 capsules with bladder instillation daily   Potassium Citrate 15 MEQ (1620 MG) TBCR Take 1 tablet (15 mEq total) by mouth 2 times daily.   promethazine (PHENERGAN) 25 MG tablet TAKE 1 TABLET BY MOUTH EVERY 6 HOURS AS NEEDED FOR NAUSEA   Sod Fluoride-Potassium Nitrate (PREVIDENT 5000 SENSITIVE) 1.1-5 % GEL Brush as directed   Spacer/Aero-Holding Chambers (OPTICHAMBER DIAMOND) MISC Use as directed   sucralfate (CARAFATE) 1 g tablet Take 1 tablet by mouth 4 times daily with meals and at bedtime.   tamsulosin (FLOMAX) 0.4 MG CAPS capsule Take 1 capsule by mouth once per day as needed when stone symptoms are present.   topiramate (TOPAMAX) 50 MG tablet TAKE 3 TABLETS BY MOUTH ONCE DAILY   [DISCONTINUED] albuterol (VENTOLIN HFA) 108 (90 Base) MCG/ACT inhaler Inhale 2 puffs by mouth every 4 hours as needed   [DISCONTINUED] azelastine (OPTIVAR) 0.05 % ophthalmic solution Place 1 drop  into both eyes 2 (two) times daily.   [DISCONTINUED] budesonide-formoterol (SYMBICORT) 160-4.5 MCG/ACT inhaler INHALE 2 PUFFS BY MOUTH TWICE DAILY   [DISCONTINUED] buPROPion (WELLBUTRIN XL) 150 MG 24 hr tablet TAKE 3 TABLETS BY MOUTH DAILY (Patient not taking: Reported on 06/28/2022)   [DISCONTINUED] buPROPion (WELLBUTRIN XL) 300 MG 24 hr tablet TAKE 1 TABLET BY MOUTH IN THE MORNING ONCE A DAY (Patient not taking: Reported on 06/28/2022)   [DISCONTINUED] diazepam (VALIUM) 10 MG tablet Insert 1 tablet into the vagina daily   [DISCONTINUED] eletriptan (RELPAX) 40 MG tablet One tablet by mouth at onset of headache. May repeat in 2 hours if headache persists or recurs.   [DISCONTINUED] fluconazole (DIFLUCAN) 150 MG tablet Take 1 tablet by mouth on day 1, day 4 and day 7 (Patient not taking:  Reported on 06/28/2022)   [DISCONTINUED] fluconazole (DIFLUCAN) 150 MG tablet Take 1 tablet (150 mg total) by mouth as directed on days 1, 4, and 7. (Patient not taking: Reported on 06/28/2022)   [DISCONTINUED] hydrochlorothiazide (HYDRODIURIL) 50 MG tablet Take 1 tablet (50 mg total) by mouth daily.   [DISCONTINUED] HYDROcodone-acetaminophen (NORCO) 7.5-325 MG tablet Take 1 tablet by mouth every 6 hours as needed. * May fill on 08/10/21   [DISCONTINUED] HYDROcodone-acetaminophen (NORCO) 7.5-325 MG tablet Take 1 tablet by mouth every 6 hours as needed for Pain.   [DISCONTINUED] HYDROcodone-acetaminophen (NORCO) 7.5-325 MG tablet Take 1 tablet by mouth every 6  hours as needed.   [DISCONTINUED] HYDROcodone-acetaminophen (NORCO) 7.5-325 MG tablet Take 1 tablet by mouth every 6 hours as needed.   [DISCONTINUED] HYDROcodone-acetaminophen (NORCO) 7.5-325 MG tablet Take 1 tablet by mouth every 6 (six) hours as needed for Pain.   [DISCONTINUED] HYDROcodone-acetaminophen (NORCO) 7.5-325 MG tablet Take 1 tablet by mouth every 6 (six) hours as needed.   [DISCONTINUED] HYDROcodone-acetaminophen (VICODIN ES) 7.5-750 MG per tablet Take  1 tablet by mouth every 6 (six) hours as needed.   [DISCONTINUED] hydrOXYzine (ATARAX) 25 MG tablet Take 25 mg by mouth 3 (three) times daily as needed.     [DISCONTINUED] levocetirizine (XYZAL) 5 MG tablet Take 1/2 tablets (2.5 mg total) by mouth every evening.   [DISCONTINUED] montelukast (SINGULAIR) 10 MG tablet TAKE 1 TABLET BY MOUTH NIGHTLY   [DISCONTINUED] montelukast (SINGULAIR) 10 MG tablet Take 1 tablet (10 mg total) by mouth every evening.   [DISCONTINUED] oxybutynin (DITROPAN-XL) 10 MG 24 hr tablet Take 1 tablet (10 mg total) by mouth daily.   [DISCONTINUED] Potassium Citrate 15 MEQ (1620 MG) TBCR Take 1 tablet by mouth 2 times daily.   [DISCONTINUED] predniSONE (STERAPRED UNI-PAK 21 TAB) 10 MG (21) TBPK tablet Take as directed per package instructions   [DISCONTINUED] Spacer/Aero-Holding Chambers (AEROCHAMBER PLUS) inhaler use as directed   [DISCONTINUED] tamsulosin (FLOMAX) 0.4 MG CAPS capsule Take 1 capsule by mouth once per day as needed when stone symptoms are present.   [DISCONTINUED] traMADol (ULTRAM) 50 MG tablet Take 50 mg by mouth every 6 (six) hours as needed.   [DISCONTINUED] triamcinolone ointment (KENALOG) 0.1 % APPLY A THIN LAYER TO THE AFFECTED AREA(S) BY TOPICALLY  2 TIMES PER DAY   No facility-administered encounter medications on file as of 06/28/2022.     REVIEW OF SYSTEMS  : All other systems reviewed and negative except where noted in the History of Present Illness.   PHYSICAL EXAM: BP 118/82   Pulse 63   Ht '5\' 4"'$  (1.626 m)   Wt 189 lb (85.7 kg)   BMI 32.44 kg/m  General: Well developed white female in no acute distress Head: Normocephalic and atraumatic Eyes:  Sclerae anicteric, conjunctiva pink. Ears: Normal auditory acuity Lungs: Clear throughout to auscultation; no W/R/R. Heart: Regular rate and rhythm; no M/R/G. Abdomen: Soft, non-distended.  BS present.  RUQ TTP. Musculoskeletal: Symmetrical with no gross deformities  Skin: No lesions on  visible extremities Extremities: No edema  Neurological: Alert oriented x 4, grossly non-focal Psychological:  Alert and cooperative. Normal mood and affect  ASSESSMENT AND PLAN: *Right upper quadrant abdominal pain and nausea: The symptoms have been present for at least close to a year and worsening.  Ultrasound last year had a 9.6 mm gallstone but was otherwise unremarkable.  An EGD and colonoscopy last year unremarkable for cause of pain.  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  Will give a small amount of Reglan to use alternating with the Zofran.  Prescription sent to pharmacy.  Will refer to [facility], she is requesting Dr. Marlou Starks to evaluate for for possible symptomatic cholelithiasis.   CC:  Shirline Frees, MD

## 2022-06-28 NOTE — Patient Instructions (Addendum)
[facility] referral placed.   We have sent the following medications to your pharmacy for you to pick up at your convenience: Reglan 5 mg every 8 hours as needed.  _______________________________________________________  If your blood pressure at your visit was 140/90 or greater, please contact your primary care physician to follow up on this.  _______________________________________________________  If you are age 47 or older, your body mass index should be between 23-30. Your Body mass index is 32.44 kg/m. If this is out of the aforementioned range listed, please consider follow up with your Primary Care Provider.  If you are age 103 or younger, your body mass index should be between 19-25. Your Body mass index is 32.44 kg/m. If this is out of the aformentioned range listed, please consider follow up with your Primary Care Provider.   ________________________________________________________  The [facility] providers would like to encourage you to use [facility] to communicate with providers for non-urgent requests or questions.  Due to long hold times on the telephone, sending your provider a message by [facility] may be a faster and more efficient way to get a response.  Please allow 48 business hours for a response.  Please remember that this is for non-urgent requests.  _______________________________________________________

## 2022-07-04 ENCOUNTER — Ambulatory Visit: Payer: Self-pay | Admitting: General Surgery

## 2022-07-04 DIAGNOSIS — K8020 Calculus of gallbladder without cholecystitis without obstruction: Secondary | ICD-10-CM | POA: Diagnosis not present

## 2022-07-13 NOTE — Patient Instructions (Signed)
DUE TO COVID-19 ONLY TWO VISITORS  (aged 47 and older)  ARE ALLOWED TO COME WITH YOU AND STAY IN THE WAITING ROOM ONLY DURING PRE OP AND PROCEDURE.   **NO VISITORS ARE ALLOWED IN THE SHORT STAY AREA OR RECOVERY ROOM!!**  IF YOU WILL BE ADMITTED INTO THE HOSPITAL YOU ARE ALLOWED ONLY FOUR SUPPORT PEOPLE DURING VISITATION HOURS ONLY (7 AM -8PM)   The support person(s) must pass our screening, gel in and out, and wear a mask at all times, including in the patient's room. Patients must also wear a mask when staff or their support person are in the room. Visitors GUEST BADGE MUST BE WORN VISIBLY  One adult visitor may remain with you overnight and MUST be in the room by 8 P.M.     Your procedure is scheduled on: 07/21/22   Report to [facility] Main Entrance    Report to admitting at : 9:45 AM   Call this number if you have problems the morning of surgery [phone] . Eat a light diet the day before surgery.  Examples including soups, broths, toast, yogurt, mashed potatoes.  Things to avoid include carbonated beverages (fizzy beverages), raw fruits and raw vegetables, or beans.   If your bowels are filled with gas, your surgeon will have difficulty visualizing your pelvic organs which increases your surgical risks.   Do not eat food :After Midnight After Midnight you may have the following liquids until : 9:00 AM DAY OF SURGERY  Water Black Coffee (sugar ok, NO MILK/CREAM OR CREAMERS)  Tea (sugar ok, NO MILK/CREAM OR CREAMERS) regular and decaf                             Plain Jell-O (NO RED)                                           Fruit ices (not with fruit pulp, NO RED)                                     Popsicles (NO RED)                                                                  Juice: apple, WHITE grape, WHITE cranberry Sports drinks like Gatorade (NO RED)              Oral Hygiene is also important to reduce your risk of infection.                                     Remember - BRUSH YOUR TEETH THE MORNING OF SURGERY WITH YOUR REGULAR TOOTHPASTE  DENTURES WILL BE REMOVED PRIOR TO SURGERY PLEASE DO NOT APPLY "Poly grip" OR ADHESIVES!!!   Do NOT smoke after Midnight   Take these medicines the morning of surgery with A SIP OF WATER: buspirone,topiramate,loratadine,tamsulosin,levothyroxine,esomeprazole,oxybutynin.Use inhalers as usual.  You may not have any metal on your body including hair pins, jewelry, and body piercing             Do not wear make-up, lotions, powders, perfumes/cologne, or deodorant  Do not wear nail polish including gel and S&S, artificial/acrylic nails, or any other type of covering on natural nails including finger and toenails. If you have artificial nails, gel coating, etc. that needs to be removed by a nail salon please have this removed prior to surgery or surgery may need to be canceled/ delayed if the surgeon/ anesthesia feels like they are unable to be safely monitored.   Do not shave  48 hours prior to surgery.    Do not bring valuables to the hospital. [place].   Contacts, glasses, or bridgework may not be worn into surgery.   Bring small overnight bag day of surgery.   DO NOT [place]. PHARMACY WILL DISPENSE MEDICATIONS LISTED ON YOUR MEDICATION LIST TO YOU DURING YOUR ADMISSION [place]!    Patients discharged on the day of surgery will not be allowed to drive home.  Someone NEEDS to stay with you for the first 24 hours after anesthesia.   Special Instructions: Bring a copy of your healthcare power of attorney and living will documents         the day of surgery if you haven't scanned them before.              Please read over the following fact sheets you were given: IF YOU HAVE QUESTIONS ABOUT YOUR PRE-OP INSTRUCTIONS PLEASE CALL [phone]    [facility] - Preparing for Surgery Before  surgery, you can play an important role.  Because skin is not sterile, your skin needs to be as free of germs as possible.  You can reduce the number of germs on your skin by washing with CHG (chlorahexidine gluconate) soap before surgery.  CHG is an antiseptic cleaner which kills germs and bonds with the skin to continue killing germs even after washing. Please DO NOT use if you have an allergy to CHG or antibacterial soaps.  If your skin becomes reddened/irritated stop using the CHG and inform your nurse when you arrive at Short Stay. Do not shave (including legs and underarms) for at least 48 hours prior to the first CHG shower.  You may shave your face/neck. Please follow these instructions carefully:  1.  Shower with CHG Soap the night before surgery and the  morning of Surgery.  2.  If you choose to wash your hair, wash your hair first as usual with your  normal  shampoo.  3.  After you shampoo, rinse your hair and body thoroughly to remove the  shampoo.                           4.  Use CHG as you would any other liquid soap.  You can apply chg directly  to the skin and wash                       Gently with a scrungie or clean washcloth.  5.  Apply the CHG Soap to your body ONLY FROM THE NECK DOWN.   Do not use on face/ open  Wound or open sores. Avoid contact with eyes, ears mouth and genitals (private parts).                       Wash face,  Genitals (private parts) with your normal soap.             6.  Wash thoroughly, paying special attention to the area where your surgery  will be performed.  7.  Thoroughly rinse your body with warm water from the neck down.  8.  DO NOT shower/wash with your normal soap after using and rinsing off  the CHG Soap.                9.  Pat yourself dry with a clean towel.            10.  Wear clean pajamas.            11.  Place clean sheets on your bed the night of your first shower and do not  sleep with pets. Day of Surgery  : Do not apply any lotions/deodorants the morning of surgery.  Please wear clean clothes to the hospital/surgery center.  FAILURE TO FOLLOW THESE INSTRUCTIONS MAY RESULT IN THE CANCELLATION OF YOUR SURGERY PATIENT SIGNATURE_________________________________  NURSE SIGNATURE__________________________________  ________________________________________________________________________

## 2022-07-14 ENCOUNTER — Encounter (HOSPITAL_COMMUNITY): Payer: Self-pay

## 2022-07-14 ENCOUNTER — Other Ambulatory Visit: Payer: Self-pay

## 2022-07-14 ENCOUNTER — Encounter (HOSPITAL_COMMUNITY)
Admission: RE | Admit: 2022-07-14 | Discharge: 2022-07-14 | Disposition: A | Discharge status: Home / Self Care | Payer: Commercial Managed Care - PPO | Source: Ambulatory Visit | Attending: General Surgery | Admitting: General Surgery

## 2022-07-14 VITALS — BP 109/67 | HR 76 | Temp 98.2°F | Ht 64.0 in | Wt 184.0 lb

## 2022-07-14 DIAGNOSIS — Z01812 Encounter for preprocedural laboratory examination: Secondary | ICD-10-CM | POA: Diagnosis not present

## 2022-07-14 DIAGNOSIS — Z01818 Encounter for other preprocedural examination: Secondary | ICD-10-CM

## 2022-07-14 HISTORY — DX: Pneumonia, unspecified organism: J18.9

## 2022-07-14 HISTORY — DX: Personal history of urinary calculi: Z87.442

## 2022-07-14 HISTORY — DX: Dyspnea, unspecified: R06.00

## 2022-07-14 HISTORY — DX: Thyrotoxicosis, unspecified without thyrotoxic crisis or storm: E05.90

## 2022-07-14 LAB — CBC
HCT: 41 % (ref 36.0–46.0)
Hemoglobin: 13.4 g/dL (ref 12.0–15.0)
MCH: 29 pg (ref 26.0–34.0)
MCHC: 32.7 g/dL (ref 30.0–36.0)
MCV: 88.7 fL (ref 80.0–100.0)
Platelets: 313 10*3/uL (ref 150–400)
RBC: 4.62 MIL/uL (ref 3.87–5.11)
RDW: 14.9 % (ref 11.5–15.5)
WBC: 9.1 10*3/uL (ref 4.0–10.5)
nRBC: 0 % (ref 0.0–0.2)

## 2022-07-14 NOTE — Progress Notes (Signed)
For Short Stay: [place] appointment date:  Bowel Prep reminder:   For Anesthesia: PCP - Dr.William Harris Cardiologist -   Chest x-ray -  EKG -  Stress Test -  ECHO -  Cardiac Cath -  Pacemaker/ICD device last checked: Pacemaker orders received: Device Rep notified:  Spinal Cord Stimulator:  Sleep Study -  CPAP -   Fasting Blood Sugar -  Checks Blood Sugar _____ times a day Date and result of last Hgb A1c-  Last dose of GLP1 agonist-  GLP1 instructions:   Last dose of SGLT-2 inhibitors-  SGLT-2 instructions:   Blood Thinner Instructions: Aspirin Instructions: Last Dose:  Activity level: Can go up a flight of stairs and activities of daily living without stopping and without chest pain and/or shortness of breath   Able to exercise without chest pain and/or shortness of breath    Anesthesia review:   Patient denies shortness of breath, fever, cough and chest pain at PAT appointment   Patient verbalized understanding of instructions that were given to them at the PAT appointment. Patient was also instructed that they will need to review over the PAT instructions again at home before surgery.

## 2022-07-15 ENCOUNTER — Other Ambulatory Visit (HOSPITAL_COMMUNITY): Payer: Self-pay

## 2022-07-18 ENCOUNTER — Other Ambulatory Visit (HOSPITAL_COMMUNITY): Payer: Self-pay

## 2022-07-28 ENCOUNTER — Ambulatory Visit (INDEPENDENT_AMBULATORY_CARE_PROVIDER_SITE_OTHER): Payer: Commercial Managed Care - PPO

## 2022-07-28 ENCOUNTER — Encounter: Payer: Self-pay | Admitting: Podiatry

## 2022-07-28 ENCOUNTER — Other Ambulatory Visit (HOSPITAL_COMMUNITY): Payer: Self-pay

## 2022-07-28 ENCOUNTER — Ambulatory Visit: Payer: Commercial Managed Care - PPO | Admitting: Podiatry

## 2022-07-28 DIAGNOSIS — S82491A Other fracture of shaft of right fibula, initial encounter for closed fracture: Secondary | ICD-10-CM

## 2022-07-28 DIAGNOSIS — M7989 Other specified soft tissue disorders: Secondary | ICD-10-CM | POA: Diagnosis not present

## 2022-07-28 DIAGNOSIS — S9001XA Contusion of right ankle, initial encounter: Secondary | ICD-10-CM | POA: Diagnosis not present

## 2022-07-28 DIAGNOSIS — M79661 Pain in right lower leg: Secondary | ICD-10-CM | POA: Diagnosis not present

## 2022-07-28 MED ORDER — WEGOVY 0.25 MG/0.5ML ~~LOC~~ SOAJ
0.2500 mg | SUBCUTANEOUS | 0 refills | Status: DC
  Filled 2022-07-28 – 2022-09-16 (×3): qty 2, 28d supply, fill #0

## 2022-07-28 MED ORDER — WEGOVY 1 MG/0.5ML ~~LOC~~ SOAJ
SUBCUTANEOUS | 0 refills | Status: DC
  Filled 2022-07-28: qty 2, 28d supply, fill #0

## 2022-07-28 MED ORDER — WEGOVY 0.5 MG/0.5ML ~~LOC~~ SOAJ
SUBCUTANEOUS | 0 refills | Status: DC
  Filled 2022-07-28: qty 2, 28d supply, fill #0

## 2022-07-28 NOTE — Progress Notes (Signed)
Subjective:  Patient ID: [name], female    DOB: 05-08-1976,  MRN: [id number] HPI Chief Complaint  Patient presents with   Ankle Injury    Lateral ankle right - twisted ankle moving furniture x 1 week ago, swelling, can't bear full weight, tried ice, CBD oil-no help   New Patient (Initial Visit)    47 y.o. female presents with the above complaint.   ROS: Denies fever chills nausea mobic muscle aches pains calf pain back pain chest pain shortness of breath.  States that she fell while lifting a box and twisted her ankle date of injury was July 23, 2022  She does relate some shortness of breath but no chest pain.  Past Medical History:  Diagnosis Date   Asthma    Bronchitis    Chronic interstitial cystitis    Dyspnea    GERD (gastroesophageal reflux disease)    History of kidney stones    History of migraine headaches    Hyperlipidemia    Hyperthyroidism    Interstitial cystitis    Pneumonia    Rhinosinusitis    Swollen lymph nodes    right side neck   Past Surgical History:  Procedure Laterality Date   BLADDER SURGERY     BREAST BIOPSY  10/15/2021   CESAREAN SECTION  [id number]   x2   COLONOSCOPY     DILATION AND CURETTAGE OF UTERUS  06/13/2001   TONSILECTOMY, ADENOIDECTOMY, BILATERAL MYRINGOTOMY AND TUBES     UPPER GASTROINTESTINAL ENDOSCOPY  05/11/2005; 12/21/2010   2006 Normal; 2012 erosive gastritis    Current Outpatient Medications:    albuterol (PROVENTIL) (2.5 MG/3ML) 0.083% nebulizer solution, Inhale 1 vial via nebulizer every 4-6 hours hours as needed for cough/wheeze, Disp: 90 mL, Rfl: 1   albuterol (VENTOLIN HFA) 108 (90 Base) MCG/ACT inhaler, Inhale 1-2 puffs into the lungs every 4 (four) hours as needed., Disp: 20.1 g, Rfl: 0   azelastine (ASTELIN) 0.1 % nasal spray, Use 1-2 sprays in each nostril 2 times a day, Disp: 30 mL, Rfl: 6   budesonide-formoterol (SYMBICORT) 160-4.5 MCG/ACT inhaler, Inhale 2 puffs into the lungs 2 (two) times  daily., Disp: 10.2 g, Rfl: 6   diazepam (VALIUM) 10 MG tablet, Insert 1 tablet vaginally daily, Disp: 30 tablet, Rfl: 5   fluticasone (FLONASE) 50 MCG/ACT nasal spray, Use 1-2 sprays in each nostril daily, Disp: 16 g, Rfl: 6   hydrochlorothiazide (HYDRODIURIL) 50 MG tablet, Take 1 tablet (50 mg total) by mouth daily., Disp: 30 tablet, Rfl: 11   HYDROcodone-acetaminophen (NORCO) 7.5-325 MG tablet, Take 1 tablet by mouth every 6 (six) hours as needed. (fill in 30 days), Disp: 120 tablet, Rfl: 0   HYDROcodone-acetaminophen (NORCO) 7.5-325 MG tablet, Take 1 tablet by mouth every 6 (six) hours as needed. (fill in 60 days), Disp: 120 tablet, Rfl: 0   hydrOXYzine (ATARAX) 25 MG tablet, TAKE 1 TO 3 TABLETS BY MOUTH AT BEDTIME, Disp: 270 tablet, Rfl: 3   levothyroxine (SYNTHROID) 125 MCG tablet, Take 1 tablet by mouth every morning on an empty stomach, Disp: 90 tablet, Rfl: 2   loratadine (CLARITIN) 10 MG tablet, Take 10 mg by mouth daily., Disp: , Rfl:    metoCLOPramide (REGLAN) 5 MG tablet, Take 1 tablet (5 mg total) by mouth every 8 (eight) hours as needed for nausea., Disp: 20 tablet, Rfl: 0   montelukast (SINGULAIR) 10 MG tablet, Take 1 tablet (10 mg total) by mouth nightly., Disp: 90 tablet, Rfl: 3   ondansetron (  ZOFRAN) 8 MG tablet, TAKE 1 TABLET BY MOUTH EVERY 8 HOURS AS NEEDED FOR NAUSEA., Disp: 30 tablet, Rfl: 11   promethazine (PHENERGAN) 25 MG tablet, TAKE 1 TABLET BY MOUTH EVERY 6 HOURS AS NEEDED FOR NAUSEA, Disp: 30 tablet, Rfl: 3   Semaglutide-Weight Management (WEGOVY) 0.25 MG/0.5ML SOAJ, Inject 0.109m under the skin every week as directed for 28 days., Disp: 2 mL, Rfl: 0   Semaglutide-Weight Management (WEGOVY) 0.5 MG/0.5ML SOAJ, Inject 0.547munder the skin every week as directed for 28 days., Disp: 2 mL, Rfl: 0   Semaglutide-Weight Management (WEGOVY) 1 MG/0.5ML SOAJ, Inject 6m34munder the skin every week as directed for 28 days., Disp: 2 mL, Rfl: 0   Sod Fluoride-Potassium Nitrate  (PREVIDENT 5000 SENSITIVE) 1.1-5 % GEL, Brush as directed, Disp: 100 mL, Rfl: 2   Spacer/Aero-Holding Chambers ([facility]) MISC, Use as directed, Disp: 1 each, Rfl: 1   topiramate (TOPAMAX) 50 MG tablet, TAKE 3 TABLETS BY MOUTH ONCE DAILY, Disp: 270 tablet, Rfl: 2  Allergies  Allergen Reactions   Latex Rash and Other (See Comments)    wheezing   Pork-Derived Products Hives, Diarrhea, Nausea And Vomiting and Palpitations   Review of Systems Objective:  There were no vitals filed for this visit.  General: Well developed, nourished, in no acute distress, alert and oriented x3   Dermatological: Skin is warm, dry and supple bilateral. Nails x 10 are well maintained; remaining integument appears unremarkable at this time. There are no open sores, no preulcerative lesions, no rash or signs of infection present.  Vascular: Dorsalis Pedis artery and Posterior Tibial artery pedal pulses are 2/4 bilateral with immedate capillary fill time. Pedal hair growth present. No varicosities and no lower extremity edema present bilateral.   Neruologic: Grossly intact via light touch bilateral. Vibratory intact via tuning fork bilateral. Protective threshold with Semmes Wienstein monofilament intact to all pedal sites bilateral. Patellar and Achilles deep tendon reflexes 2+ bilateral. No Babinski or clonus noted bilateral.   Musculoskeletal: No gross boney pedal deformities bilateral. No pain, crepitus, or limitation noted with foot and ankle range of motion bilateral. Muscular strength 5/5 in all groups tested bilateral.  Moderate edema to the lateral aspect of the right foot and ankle moderate tenderness on the lateral compression of the calf the Achilles is intact she does have tenderness on palpation of the fibula.  Gait: Unassisted, Nonantalgic.    Radiographs:  Radiographs taken today of the ankle demonstrate a very small area periostitis or periosteal fluffing around the distal one third of  the tibia does not demonstrate a through and through fracture but I do think that this is a cortical interruption.  Mortise is normal.  Assessment & Plan:   Assessment: I do think she has a stress fracture at least to the fibular right I do think that she should have a D-dimer done to be followed by an ultrasound if positive on the right calf.  Just to rule out DVT.   Plan: Instructed on aspirin daily with warm moist heat and placed in a cam boot provided her with requisition for blood work.  She will follow-up with us [place] a couple of weeks.  Another set of x-rays will be necessary at that time.     Jaken Fregia T. HyaHillcrestPM[place]

## 2022-07-29 ENCOUNTER — Other Ambulatory Visit (HOSPITAL_COMMUNITY): Payer: Self-pay

## 2022-07-29 ENCOUNTER — Ambulatory Visit (HOSPITAL_COMMUNITY)
Admission: RE | Admit: 2022-07-29 | Discharge: 2022-07-29 | Disposition: A | Discharge status: Home / Self Care | Payer: Commercial Managed Care - PPO | Source: Ambulatory Visit | Attending: Internal Medicine | Admitting: Internal Medicine

## 2022-07-29 ENCOUNTER — Ambulatory Visit: Payer: Commercial Managed Care - PPO | Admitting: Internal Medicine

## 2022-07-29 ENCOUNTER — Other Ambulatory Visit: Payer: Self-pay

## 2022-07-29 DIAGNOSIS — M7989 Other specified soft tissue disorders: Secondary | ICD-10-CM | POA: Diagnosis not present

## 2022-07-29 DIAGNOSIS — M79661 Pain in right lower leg: Secondary | ICD-10-CM

## 2022-07-29 LAB — D-DIMER, QUANTITATIVE: D-Dimer, Quant: 0.4 mcg/mL FEU (ref <0.50)

## 2022-07-29 MED ORDER — FLUTICASONE PROPIONATE 50 MCG/ACT NA SUSP
NASAL | 6 refills | Status: DC
  Filled 2022-07-29: qty 16, 30d supply, fill #0

## 2022-07-29 MED ORDER — AZELASTINE HCL 0.1 % NA SOLN
NASAL | 6 refills | Status: DC
  Filled 2022-07-29: qty 30, 30d supply, fill #0

## 2022-07-29 MED ORDER — ALBUTEROL SULFATE HFA 108 (90 BASE) MCG/ACT IN AERS
INHALATION_SPRAY | RESPIRATORY_TRACT | 0 refills | Status: DC
  Filled 2022-07-29: qty 20.1, 50d supply, fill #0

## 2022-08-02 ENCOUNTER — Other Ambulatory Visit (HOSPITAL_COMMUNITY): Payer: Self-pay

## 2022-08-02 DIAGNOSIS — N3010 Interstitial cystitis (chronic) without hematuria: Secondary | ICD-10-CM | POA: Diagnosis not present

## 2022-08-02 DIAGNOSIS — R102 Pelvic and perineal pain: Secondary | ICD-10-CM | POA: Diagnosis not present

## 2022-08-02 MED ORDER — DIAZEPAM 10 MG PO TABS
ORAL_TABLET | ORAL | 5 refills | Status: AC
  Filled 2022-08-02: qty 30, 30d supply, fill #0
  Filled 2022-09-23: qty 30, 30d supply, fill #1

## 2022-08-03 ENCOUNTER — Other Ambulatory Visit (HOSPITAL_COMMUNITY): Payer: Self-pay

## 2022-08-03 MED ORDER — NITROFURANTOIN MACROCRYSTAL 50 MG PO CAPS
50.0000 mg | ORAL_CAPSULE | Freq: Every evening | ORAL | 3 refills | Status: AC
  Filled 2022-08-03: qty 90, 90d supply, fill #0

## 2022-08-03 MED ORDER — LORATADINE 10 MG PO TABS
10.0000 mg | ORAL_TABLET | Freq: Every day | ORAL | 3 refills | Status: AC
  Filled 2022-12-13: qty 100, 100d supply, fill #0

## 2022-08-03 MED ORDER — HYDROCODONE-ACETAMINOPHEN 7.5-325 MG PO TABS
1.0000 | ORAL_TABLET | Freq: Four times a day (QID) | ORAL | 0 refills | Status: DC
  Filled 2022-10-18: qty 120, 30d supply, fill #0

## 2022-08-03 MED ORDER — HYDROCODONE-ACETAMINOPHEN 7.5-325 MG PO TABS
1.0000 | ORAL_TABLET | Freq: Four times a day (QID) | ORAL | 0 refills | Status: AC | PRN
  Filled 2022-08-15: qty 120, 30d supply, fill #0

## 2022-08-03 MED ORDER — MONTELUKAST SODIUM 10 MG PO TABS
10.0000 mg | ORAL_TABLET | Freq: Every evening | ORAL | 3 refills | Status: AC
  Filled 2022-08-03: qty 90, 90d supply, fill #0

## 2022-08-03 MED ORDER — HYDROCODONE-ACETAMINOPHEN 7.5-325 MG PO TABS
1.0000 | ORAL_TABLET | Freq: Four times a day (QID) | ORAL | 0 refills | Status: AC | PRN
  Filled 2022-09-19: qty 120, 30d supply, fill #0

## 2022-08-03 MED ORDER — HYDROXYZINE HCL 25 MG PO TABS
25.0000 mg | ORAL_TABLET | Freq: Every day | ORAL | 3 refills | Status: AC
  Filled 2022-08-03: qty 270, 90d supply, fill #0

## 2022-08-03 MED ORDER — ME/NAPHOS/MB/HYO1 81.6 MG PO TABS: 1.0000 | ORAL_TABLET | Freq: Four times a day (QID) | ORAL | 99 refills | Status: AC | PRN

## 2022-08-04 ENCOUNTER — Telehealth: Payer: Self-pay | Admitting: *Deleted

## 2022-08-04 NOTE — Telephone Encounter (Signed)
-----   Message from Garrel Ridgel, [place] sent at 08/03/2022  3:58 PM EST ----- No clots. Wear foot and continue asa qd.

## 2022-08-09 NOTE — Progress Notes (Signed)
Spoke to patient to give updated information for surgery on 08-11-22. No change in medical history per patient.   Patient was advised to arrive at 6:15 and check in at admitting.  Reminded no solid food after midnight and that from midnight until 5:30 that she can have clear liquids.  Patient's son Brany Linsey will be picking up patient from surgery.  All questions answered and patient stated understanding.

## 2022-08-10 ENCOUNTER — Encounter (HOSPITAL_COMMUNITY): Payer: Self-pay | Admitting: General Surgery

## 2022-08-10 ENCOUNTER — Other Ambulatory Visit (HOSPITAL_COMMUNITY): Payer: Self-pay

## 2022-08-10 NOTE — Anesthesia Preprocedure Evaluation (Signed)
Anesthesia Evaluation  Patient identified by MRN, date of birth, ID band Patient awake    Reviewed: Allergy & Precautions, NPO status , Patient's Chart, lab work & pertinent test results  Airway Mallampati: II  TM Distance: >3 FB Neck ROM: Full    Dental no notable dental hx.    Pulmonary asthma    Pulmonary exam normal breath sounds clear to auscultation       Cardiovascular negative cardio ROS Normal cardiovascular exam     Neuro/Psych  Headaches  Anxiety        GI/Hepatic negative GI ROS,,,(+)     substance abuse    Endo/Other  Hypothyroidism    Renal/GU negative Renal ROSInterstitial cystitis     Musculoskeletal negative musculoskeletal ROS (+)  narcotic dependent  Abdominal   Peds  Hematology negative hematology ROS (+)   Anesthesia Other Findings GALLSTONES  Reproductive/Obstetrics Hcg negative                             Anesthesia Physical Anesthesia Plan  ASA: 2  Anesthesia Plan: General   Post-op Pain Management:    Induction: Intravenous  PONV Risk Score and Plan: 4 or greater and Ondansetron, Dexamethasone, Midazolam, Scopolamine patch - Pre-op and Treatment may vary due to age or medical condition  Airway Management Planned: Oral ETT  Additional Equipment:   Intra-op Plan:   Post-operative Plan: Extubation in OR  Informed Consent: I have reviewed the patients History and Physical, chart, labs and discussed the procedure including the risks, benefits and alternatives for the proposed anesthesia with the patient or authorized representative who has indicated his/her understanding and acceptance.     Dental advisory given  Plan Discussed with: CRNA  Anesthesia Plan Comments:        Anesthesia Quick Evaluation

## 2022-08-11 ENCOUNTER — Other Ambulatory Visit: Payer: Self-pay

## 2022-08-11 ENCOUNTER — Ambulatory Visit (HOSPITAL_BASED_OUTPATIENT_CLINIC_OR_DEPARTMENT_OTHER): Payer: Commercial Managed Care - PPO | Admitting: Anesthesiology

## 2022-08-11 ENCOUNTER — Ambulatory Visit (HOSPITAL_COMMUNITY): Payer: Commercial Managed Care - PPO | Admitting: Anesthesiology

## 2022-08-11 ENCOUNTER — Ambulatory Visit (HOSPITAL_COMMUNITY)
Admission: RE | Admit: 2022-08-11 | Discharge: 2022-08-11 | Disposition: A | Discharge status: Home / Self Care | Payer: Commercial Managed Care - PPO | Attending: General Surgery | Admitting: General Surgery

## 2022-08-11 ENCOUNTER — Encounter (HOSPITAL_COMMUNITY): Payer: Self-pay | Admitting: General Surgery

## 2022-08-11 ENCOUNTER — Encounter (HOSPITAL_COMMUNITY)
Admission: RE | Disposition: A | Discharge status: Home / Self Care | Payer: Self-pay | Source: Home / Self Care | Attending: General Surgery

## 2022-08-11 ENCOUNTER — Other Ambulatory Visit (HOSPITAL_COMMUNITY): Payer: Self-pay

## 2022-08-11 ENCOUNTER — Ambulatory Visit (HOSPITAL_COMMUNITY): Payer: Commercial Managed Care - PPO

## 2022-08-11 DIAGNOSIS — K8020 Calculus of gallbladder without cholecystitis without obstruction: Secondary | ICD-10-CM

## 2022-08-11 DIAGNOSIS — K8000 Calculus of gallbladder with acute cholecystitis without obstruction: Secondary | ICD-10-CM | POA: Diagnosis not present

## 2022-08-11 DIAGNOSIS — E785 Hyperlipidemia, unspecified: Secondary | ICD-10-CM | POA: Diagnosis not present

## 2022-08-11 DIAGNOSIS — E039 Hypothyroidism, unspecified: Secondary | ICD-10-CM | POA: Diagnosis not present

## 2022-08-11 DIAGNOSIS — F419 Anxiety disorder, unspecified: Secondary | ICD-10-CM | POA: Diagnosis not present

## 2022-08-11 DIAGNOSIS — Z01818 Encounter for other preprocedural examination: Secondary | ICD-10-CM

## 2022-08-11 DIAGNOSIS — J45909 Unspecified asthma, uncomplicated: Secondary | ICD-10-CM | POA: Insufficient documentation

## 2022-08-11 DIAGNOSIS — R519 Headache, unspecified: Secondary | ICD-10-CM | POA: Insufficient documentation

## 2022-08-11 DIAGNOSIS — K8010 Calculus of gallbladder with chronic cholecystitis without obstruction: Secondary | ICD-10-CM | POA: Insufficient documentation

## 2022-08-11 HISTORY — PX: CHOLECYSTECTOMY: SHX55

## 2022-08-11 LAB — POCT PREGNANCY, URINE: Preg Test, Ur: NEGATIVE

## 2022-08-11 SURGERY — LAPAROSCOPIC CHOLECYSTECTOMY WITH INTRAOPERATIVE CHOLANGIOGRAM
Anesthesia: General

## 2022-08-11 MED ORDER — FENTANYL CITRATE (PF) 100 MCG/2ML IJ SOLN
INTRAMUSCULAR | Status: AC
  Filled 2022-08-11: qty 2

## 2022-08-11 MED ORDER — BUPIVACAINE-EPINEPHRINE 0.25% -1:200000 IJ SOLN
INTRAMUSCULAR | Status: DC | PRN
  Administered 2022-08-11: 20 mL

## 2022-08-11 MED ORDER — OXYCODONE HCL 5 MG PO TABS
5.0000 mg | ORAL_TABLET | Freq: Four times a day (QID) | ORAL | 0 refills | Status: AC | PRN
  Filled 2022-08-11: qty 15, 4d supply, fill #0

## 2022-08-11 MED ORDER — HYDROMORPHONE HCL 1 MG/ML IJ SOLN
INTRAMUSCULAR | Status: AC
  Filled 2022-08-11: qty 1

## 2022-08-11 MED ORDER — PROMETHAZINE HCL 25 MG/ML IJ SOLN: 6.2500 mg | INTRAMUSCULAR | Status: DC | PRN

## 2022-08-11 MED ORDER — PROPOFOL 10 MG/ML IV BOLUS
INTRAVENOUS | Status: DC | PRN
  Administered 2022-08-11: 150 mg via INTRAVENOUS

## 2022-08-11 MED ORDER — ROCURONIUM BROMIDE 10 MG/ML (PF) SYRINGE
PREFILLED_SYRINGE | INTRAVENOUS | Status: AC
  Filled 2022-08-11: qty 20

## 2022-08-11 MED ORDER — AMISULPRIDE (ANTIEMETIC) 5 MG/2ML IV SOLN
INTRAVENOUS | Status: AC
  Filled 2022-08-11: qty 4

## 2022-08-11 MED ORDER — CELECOXIB 200 MG PO CAPS
200.0000 mg | ORAL_CAPSULE | ORAL | Status: AC
  Administered 2022-08-11: 200 mg via ORAL
  Filled 2022-08-11: qty 1

## 2022-08-11 MED ORDER — OXYCODONE HCL 5 MG/5ML PO SOLN: 5.0000 mg | Freq: Once | ORAL | Status: AC | PRN

## 2022-08-11 MED ORDER — PROPOFOL 10 MG/ML IV BOLUS
INTRAVENOUS | Status: AC
  Filled 2022-08-11: qty 20

## 2022-08-11 MED ORDER — LACTATED RINGERS IV SOLN: INTRAVENOUS | Status: DC

## 2022-08-11 MED ORDER — DEXAMETHASONE SODIUM PHOSPHATE 10 MG/ML IJ SOLN
INTRAMUSCULAR | Status: AC
  Filled 2022-08-11: qty 2

## 2022-08-11 MED ORDER — LACTATED RINGERS IR SOLN
Status: DC | PRN
  Administered 2022-08-11: 1000 mL

## 2022-08-11 MED ORDER — ROCURONIUM BROMIDE 10 MG/ML (PF) SYRINGE
PREFILLED_SYRINGE | INTRAVENOUS | Status: DC | PRN
  Administered 2022-08-11: 20 mg via INTRAVENOUS
  Administered 2022-08-11: 40 mg via INTRAVENOUS

## 2022-08-11 MED ORDER — MIDAZOLAM HCL 2 MG/2ML IJ SOLN
INTRAMUSCULAR | Status: AC
  Filled 2022-08-11: qty 2

## 2022-08-11 MED ORDER — CHLORHEXIDINE GLUCONATE CLOTH 2 % EX PADS: 6.0000 | MEDICATED_PAD | Freq: Once | CUTANEOUS | Status: DC

## 2022-08-11 MED ORDER — ONDANSETRON HCL 4 MG/2ML IJ SOLN
INTRAMUSCULAR | Status: AC
  Filled 2022-08-11: qty 4

## 2022-08-11 MED ORDER — GABAPENTIN 300 MG PO CAPS
300.0000 mg | ORAL_CAPSULE | ORAL | Status: AC
  Administered 2022-08-11: 300 mg via ORAL
  Filled 2022-08-11: qty 1

## 2022-08-11 MED ORDER — HYDROMORPHONE HCL 1 MG/ML IJ SOLN
0.2500 mg | INTRAMUSCULAR | Status: DC | PRN
  Administered 2022-08-11: 0.25 mg via INTRAVENOUS
  Administered 2022-08-11 (×2): 0.5 mg via INTRAVENOUS
  Administered 2022-08-11: 0.25 mg via INTRAVENOUS
  Administered 2022-08-11: 0.5 mg via INTRAVENOUS

## 2022-08-11 MED ORDER — SODIUM CHLORIDE (PF) 0.9 % IJ SOLN
INTRAMUSCULAR | Status: DC | PRN
  Administered 2022-08-11: 7 mL

## 2022-08-11 MED ORDER — ONDANSETRON HCL 4 MG/2ML IJ SOLN
INTRAMUSCULAR | Status: DC | PRN
  Administered 2022-08-11: 4 mg via INTRAVENOUS

## 2022-08-11 MED ORDER — KETAMINE HCL 10 MG/ML IJ SOLN
INTRAMUSCULAR | Status: DC | PRN
  Administered 2022-08-11: 30 mg via INTRAVENOUS
  Administered 2022-08-11 (×2): 10 mg via INTRAVENOUS

## 2022-08-11 MED ORDER — BUPIVACAINE-EPINEPHRINE (PF) 0.25% -1:200000 IJ SOLN
INTRAMUSCULAR | Status: AC
  Filled 2022-08-11: qty 30

## 2022-08-11 MED ORDER — OXYCODONE HCL 5 MG PO TABS
ORAL_TABLET | ORAL | Status: AC
  Filled 2022-08-11: qty 1

## 2022-08-11 MED ORDER — AMISULPRIDE (ANTIEMETIC) 5 MG/2ML IV SOLN
10.0000 mg | Freq: Once | INTRAVENOUS | Status: AC | PRN
  Administered 2022-08-11: 10 mg via INTRAVENOUS

## 2022-08-11 MED ORDER — ACETAMINOPHEN 500 MG PO TABS
1000.0000 mg | ORAL_TABLET | ORAL | Status: AC
  Administered 2022-08-11: 1000 mg via ORAL
  Filled 2022-08-11: qty 2

## 2022-08-11 MED ORDER — CHLORHEXIDINE GLUCONATE 0.12 % MT SOLN
15.0000 mL | Freq: Once | OROMUCOSAL | Status: AC
  Administered 2022-08-11: 15 mL via OROMUCOSAL

## 2022-08-11 MED ORDER — PHENYLEPHRINE 80 MCG/ML (10ML) SYRINGE FOR IV PUSH (FOR BLOOD PRESSURE SUPPORT)
PREFILLED_SYRINGE | INTRAVENOUS | Status: DC | PRN
  Administered 2022-08-11: 120 ug via INTRAVENOUS

## 2022-08-11 MED ORDER — OXYCODONE HCL 5 MG PO TABS
5.0000 mg | ORAL_TABLET | Freq: Once | ORAL | Status: AC | PRN
  Administered 2022-08-11: 5 mg via ORAL

## 2022-08-11 MED ORDER — KETAMINE HCL 50 MG/5ML IJ SOSY
PREFILLED_SYRINGE | INTRAMUSCULAR | Status: AC
  Filled 2022-08-11: qty 5

## 2022-08-11 MED ORDER — ORAL CARE MOUTH RINSE: 15.0000 mL | Freq: Once | OROMUCOSAL | Status: AC

## 2022-08-11 MED ORDER — FENTANYL CITRATE PF 50 MCG/ML IJ SOSY
PREFILLED_SYRINGE | INTRAMUSCULAR | Status: AC
  Filled 2022-08-11: qty 2

## 2022-08-11 MED ORDER — CEFAZOLIN SODIUM-DEXTROSE 2-4 GM/100ML-% IV SOLN
2.0000 g | INTRAVENOUS | Status: AC
  Administered 2022-08-11: 2 g via INTRAVENOUS
  Filled 2022-08-11: qty 100

## 2022-08-11 MED ORDER — SCOPOLAMINE 1 MG/3DAYS TD PT72
1.0000 | MEDICATED_PATCH | TRANSDERMAL | Status: DC
  Administered 2022-08-11: 1.5 mg via TRANSDERMAL
  Filled 2022-08-11: qty 1

## 2022-08-11 MED ORDER — FENTANYL CITRATE PF 50 MCG/ML IJ SOSY
25.0000 ug | PREFILLED_SYRINGE | INTRAMUSCULAR | Status: DC | PRN
  Administered 2022-08-11 (×2): 50 ug via INTRAVENOUS

## 2022-08-11 MED ORDER — LIDOCAINE HCL (PF) 2 % IJ SOLN
INTRAMUSCULAR | Status: AC
  Filled 2022-08-11: qty 10

## 2022-08-11 MED ORDER — MIDAZOLAM HCL 5 MG/5ML IJ SOLN
INTRAMUSCULAR | Status: DC | PRN
  Administered 2022-08-11: 2 mg via INTRAVENOUS

## 2022-08-11 MED ORDER — LIDOCAINE 2% (20 MG/ML) 5 ML SYRINGE
INTRAMUSCULAR | Status: DC | PRN
  Administered 2022-08-11: 80 mg via INTRAVENOUS

## 2022-08-11 MED ORDER — DEXAMETHASONE SODIUM PHOSPHATE 10 MG/ML IJ SOLN
INTRAMUSCULAR | Status: DC | PRN
  Administered 2022-08-11: 10 mg via INTRAVENOUS

## 2022-08-11 MED ORDER — SUGAMMADEX SODIUM 200 MG/2ML IV SOLN
INTRAVENOUS | Status: DC | PRN
  Administered 2022-08-11: 200 mg via INTRAVENOUS

## 2022-08-11 MED ORDER — FENTANYL CITRATE (PF) 100 MCG/2ML IJ SOLN
INTRAMUSCULAR | Status: DC | PRN
  Administered 2022-08-11: 100 ug via INTRAVENOUS

## 2022-08-11 SURGICAL SUPPLY — 36 items
ADH SKN CLS APL DERMABOND .7 (GAUZE/BANDAGES/DRESSINGS) ×1
APL PRP STRL LF DISP 70% ISPRP (MISCELLANEOUS) ×1
APPLIER CLIP 5 13 M/L LIGAMAX5 (MISCELLANEOUS) ×1
APR CLP MED LRG 5 ANG JAW (MISCELLANEOUS) ×1
BAG COUNTER SPONGE SURGICOUNT (BAG) IMPLANT
BAG SPNG CNTER NS LX DISP (BAG)
CABLE HIGH FREQUENCY MONO STRZ (ELECTRODE) ×1 IMPLANT
CATH REDDICK CHOLANGI 4FR 50CM (CATHETERS) ×1 IMPLANT
CHLORAPREP W/TINT 26 (MISCELLANEOUS) ×1 IMPLANT
CLIP APPLIE 5 13 M/L LIGAMAX5 (MISCELLANEOUS) ×1 IMPLANT
COVER MAYO STAND XLG (MISCELLANEOUS) ×1 IMPLANT
DERMABOND ADVANCED .7 DNX12 (GAUZE/BANDAGES/DRESSINGS) ×1 IMPLANT
DRAPE C-ARM 42X120 X-RAY (DRAPES) ×1 IMPLANT
ELECT REM PT RETURN 15FT ADLT (MISCELLANEOUS) ×1 IMPLANT
GLOVE BIO SURGEON STRL SZ7.5 (GLOVE) ×1 IMPLANT
GOWN STRL REUS W/ TWL LRG LVL3 (GOWN DISPOSABLE) IMPLANT
GOWN STRL REUS W/TWL LRG LVL3 (GOWN DISPOSABLE)
HEMOSTAT SURGICEL 4X8 (HEMOSTASIS) IMPLANT
IRRIG SUCT STRYKERFLOW 2 WTIP (MISCELLANEOUS) ×1
IRRIGATION SUCT STRKRFLW 2 WTP (MISCELLANEOUS) ×1 IMPLANT
IV CATH 14GX2 1/4 (CATHETERS) ×1 IMPLANT
KIT BASIN OR (CUSTOM PROCEDURE TRAY) ×1 IMPLANT
KIT TURNOVER KIT A (KITS) IMPLANT
PENCIL SMOKE EVACUATOR (MISCELLANEOUS) IMPLANT
SCISSORS LAP 5X35 DISP (ENDOMECHANICALS) ×1 IMPLANT
SET TUBE SMOKE EVAC HIGH FLOW (TUBING) ×1 IMPLANT
SLEEVE Z-THREAD 5X100MM (TROCAR) ×2 IMPLANT
SPIKE FLUID TRANSFER (MISCELLANEOUS) ×1 IMPLANT
SUT MNCRL AB 4-0 PS2 18 (SUTURE) ×1 IMPLANT
SYS BAG RETRIEVAL 10MM (BASKET) ×1
SYSTEM BAG RETRIEVAL 10MM (BASKET) ×1 IMPLANT
TOWEL OR 17X26 10 PK STRL BLUE (TOWEL DISPOSABLE) ×1 IMPLANT
TOWEL OR NON WOVEN STRL DISP B (DISPOSABLE) ×1 IMPLANT
TRAY LAPAROSCOPIC (CUSTOM PROCEDURE TRAY) ×1 IMPLANT
TROCAR BALLN 12MMX100 BLUNT (TROCAR) ×1 IMPLANT
TROCAR Z-THREAD OPTICAL 5X100M (TROCAR) ×1 IMPLANT

## 2022-08-11 NOTE — Interval H&P Note (Signed)
History and Physical Interval Note:  08/11/2022 7:54 AM  [name]  has presented today for surgery, with the diagnosis of GALLSTONES.  The various methods of treatment have been discussed with the patient and family. After consideration of risks, benefits and other options for treatment, the patient has consented to  Procedure(s): LAPAROSCOPIC CHOLECYSTECTOMY WITH INTRAOPERATIVE CHOLANGIOGRAM (N/A) as a surgical intervention.  The patient's history has been reviewed, patient examined, no change in status, stable for surgery.  I have reviewed the patient's chart and labs.  Questions were answered to the patient's satisfaction.     Autumn Messing III

## 2022-08-11 NOTE — Anesthesia Procedure Notes (Signed)
Procedure Name: Intubation Date/Time: 08/11/2022 8:30 AM  Performed by: Lavina Hamman, CRNAPre-anesthesia Checklist: Patient identified, Emergency Drugs available, Suction available, Patient being monitored and Timeout performed Patient Re-evaluated:Patient Re-evaluated prior to induction Oxygen Delivery Method: Circle system utilized Preoxygenation: Pre-oxygenation with 100% oxygen Induction Type: IV induction Ventilation: Mask ventilation without difficulty Laryngoscope Size: Mac and 3 Grade View: Grade I Tube type: Oral Tube size: 7.5 mm Number of attempts: 1 Airway Equipment and Method: Stylet Placement Confirmation: ETT inserted through vocal cords under direct vision, positive ETCO2, CO2 detector and breath sounds checked- equal and bilateral Secured at: 21 cm Tube secured with: Tape Dental Injury: Teeth and Oropharynx as per pre-operative assessment  Comments: ATOI

## 2022-08-11 NOTE — Anesthesia Postprocedure Evaluation (Signed)
Anesthesia Post Note  Patient: [name]  Procedure(s) Performed: LAPAROSCOPIC CHOLECYSTECTOMY WITH INTRAOPERATIVE CHOLANGIOGRAM     Patient location during evaluation: PACU Anesthesia Type: General Level of consciousness: awake Pain management: pain level controlled Vital Signs Assessment: post-procedure vital signs reviewed and stable Respiratory status: spontaneous breathing, nonlabored ventilation and respiratory function stable Cardiovascular status: blood pressure returned to baseline and stable Postop Assessment: no apparent nausea or vomiting Anesthetic complications: no   No notable events documented.  Last Vitals:  Vitals:   08/11/22 1100 08/11/22 1120  BP: 114/77 118/72  Pulse: 77 73  Resp: 11 20  Temp: 36.6 C 36.4 C  SpO2: 98% 99%    Last Pain:  Vitals:   08/11/22 1120  TempSrc: Oral  PainSc: 0-No pain                 Shamar Engelmann P Jasmen Emrich

## 2022-08-11 NOTE — H&P (Signed)
REFERRING PHYSICIAN: Loralie Champagne, PA  PROVIDER: Landry Corporal, MD  MRN: [id number] DOB: December 22, 1975 Subjective   Chief Complaint: New Consultation (Gallstones)   History of Present Illness: [name] is a 47 y.o. female who is seen today as an office consultation for evaluation of New Consultation (Gallstones) .   We are asked to see the patient in consultation by Dr. Myrtice Lauth to evaluate her for gallstones. The patient is a 47 year old white female who has been having pretty significant right upper quadrant pain with severe nausea over the last couple months. She was diagnosed with a single gallstone measuring 9.6 mm back in April 2023. Her pain and nausea has gotten significantly worse over the last couple months. She is otherwise in good health and does not smoke. Her liver functions in April were normal  Review of Systems: A complete review of systems was obtained from the patient. I have reviewed this information and discussed as appropriate with the patient. See HPI as well for other ROS.  ROS   Medical History: Past Medical History:  Diagnosis Date  Arthritis  Asthma, unspecified asthma severity, unspecified whether complicated, unspecified whether persistent  Hyperlipidemia  Thyroid disease   Patient Active Problem List  Diagnosis  Calculus of gallbladder without cholecystitis without obstruction   Past Surgical History:  Procedure Laterality Date  TONSILLECTOMY 10/1992  BLADDER SURGERY 06/2017  CESAREAN SECTION  07/1996, 06/2003    Allergies  Allergen Reactions  Bupropion Hcl Anaphylaxis  Latex Itching, Other (See Comments) and Rash  wheezing  No reaction listed.  Levocetirizine Shortness Of Breath  Poractant Alfa Other (See Comments)  Pork Derived (Porcine) Swelling   Current Outpatient Medications on File Prior to Visit  Medication Sig Dispense Refill  albuterol (PROVENTIL) 2.5 mg /3 mL (0.083 %) nebulizer solution Inhale 1 vial via nebulizer  every 4-6 hours hours as needed for cough/wheeze  hydroCHLOROthiazide (HYDRODIURIL) 50 MG tablet Take 50 mg by mouth once daily  HYDROcodone-acetaminophen (NORCO) 7.5-325 mg tablet Take by mouth  levothyroxine (SYNTHROID) 125 MCG tablet Take 1 tablet by mouth every morning before breakfast (0630)  montelukast (SINGULAIR) 10 mg tablet 1 tablet in the evening Orally Once a day for 30 days  ondansetron (ZOFRAN) 8 MG tablet Take 1 tablet by mouth every 8 (eight) hours as needed  promethazine (PHENERGAN) 25 MG tablet Take 1 tablet by mouth every 6 (six) hours as needed  SYMBICORT 160-4.5 mcg/actuation inhaler 2 puffs Inhalation Twice a day for 30 days  topiramate (TOPAMAX) 100 MG tablet Take 100 mg by mouth once daily   No current facility-administered medications on file prior to visit.   History reviewed. No pertinent family history.   Social History   Tobacco Use  Smoking Status Never  Smokeless Tobacco Never    Social History   Socioeconomic History  Marital status: Single  Tobacco Use  Smoking status: Never  Smokeless tobacco: Never  Substance and Sexual Activity  Alcohol use: Not Currently  Drug use: Never   Objective:   Vitals:  BP: 130/81  Pulse: 83  Temp: 36.5 C (97.7 F)  SpO2: 95%  Weight: 83.9 kg (185 lb)  Height: 162.6 cm ('5\' 4"'$ )   Body mass index is 31.76 kg/m.  Physical Exam Vitals reviewed.  Constitutional:  General: She is not in acute distress. Appearance: Normal appearance.  HENT:  Head: Normocephalic and atraumatic.  Right Ear: External ear normal.  Left Ear: External ear normal.  Nose: Nose normal.  Mouth/Throat:  Mouth: Mucous  membranes are moist.  Pharynx: Oropharynx is clear.  Eyes:  General: No scleral icterus. Extraocular Movements: Extraocular movements intact.  Conjunctiva/sclera: Conjunctivae normal.  Pupils: Pupils are equal, round, and reactive to light.  Cardiovascular:  Rate and Rhythm: Normal rate and regular rhythm.   Pulses: Normal pulses.  Heart sounds: Normal heart sounds.  Pulmonary:  Effort: Pulmonary effort is normal. No respiratory distress.  Breath sounds: Normal breath sounds.  Abdominal:  General: Bowel sounds are normal.  Palpations: Abdomen is soft.  Tenderness: There is abdominal tenderness.  Comments: There is mild to moderate tenderness in the right upper quadrant. There is no palpable mass  Musculoskeletal:  General: No swelling, tenderness or deformity. Normal range of motion.  Cervical back: Normal range of motion and neck supple.  Skin: General: Skin is warm and dry.  Coloration: Skin is not jaundiced.  Neurological:  General: No focal deficit present.  Mental Status: She is alert and oriented to person, place, and time.  Psychiatric:  Mood and Affect: Mood normal.  Behavior: Behavior normal.     Labs, Imaging and Diagnostic Testing:  Assessment and Plan:   Diagnoses and all orders for this visit:  Calculus of gallbladder without cholecystitis without obstruction    The patient appears to have a symptomatic gallstone. Because of the risk of further painful episodes and possible pancreatitis I feel she would benefit from having her gallbladder removed. She would also like to have this done. I have discussed with her in detail the risks and benefits of the operation to remove the gallbladder as well as some of the technical aspects including the risk of common bile duct injury and she understands and wishes to proceed. We will move forward with surgical scheduling.

## 2022-08-11 NOTE — Op Note (Signed)
08/11/2022  9:10 AM  PATIENT:  [name]  47 y.o. female  PRE-OPERATIVE DIAGNOSIS:  GALLSTONES  POST-OPERATIVE DIAGNOSIS:  GALLSTONES  PROCEDURE:  Procedure(s): LAPAROSCOPIC CHOLECYSTECTOMY WITH INTRAOPERATIVE CHOLANGIOGRAM (N/A)  SURGEON:  Surgeon(s) and Role:    * Jovita Kussmaul, MD - Primary  PHYSICIAN ASSISTANT:   ASSISTANTS: Judyann Munson, RNFA   ANESTHESIA:   local and general  EBL:  20 mL   BLOOD ADMINISTERED:none  DRAINS: none   LOCAL MEDICATIONS USED:  MARCAINE     SPECIMEN:  Source of Specimen:  gallbladder  DISPOSITION OF SPECIMEN:  PATHOLOGY  COUNTS:  YES  TOURNIQUET:  * No tourniquets in log *  DICTATION: .Dragon Dictation    Procedure: After informed consent was obtained the patient was brought to the operating room and placed in the supine position on the operating room table. After adequate induction of general anesthesia the patient's abdomen was prepped with ChloraPrep allowed to dry and draped in usual sterile manner. An appropriate timeout was performed. The area below the umbilicus was infiltrated with quarter percent  Marcaine. A small incision was made with a 15 blade knife. The incision was carried down through the subcutaneous tissue bluntly with a hemostat and [employer] retractors. The linea alba was identified. The linea alba was incised with a 15 blade knife and each side was grasped with Coker clamps. The preperitoneal space was then probed with a hemostat until the peritoneum was opened and access was gained to the abdominal cavity. A 0 Vicryl pursestring stitch was placed in the fascia surrounding the opening. A Hassan cannula was then placed through the opening and anchored in place with the previously placed Vicryl purse string stitch. The abdomen was insufflated with carbon dioxide without difficulty. A laparoscope was inserted through the [facility] cannula in the right upper quadrant was inspected. Next the epigastric region was  infiltrated with % Marcaine. A small incision was made with a 15 blade knife. A 5 mm port was placed bluntly through this incision into the abdominal cavity under direct vision. Next 2 sites were chosen laterally on the right side of the abdomen for placement of 5 mm ports. Each of these areas was infiltrated with quarter percent Marcaine. Small stab incisions were made with a 15 blade knife. 5 mm ports were then placed bluntly through these incisions into the abdominal cavity under direct vision without difficulty. A blunt grasper was placed through the lateralmost 5 mm port and used to grasp the dome of the gallbladder and elevate it anteriorly and superiorly. Another blunt grasper was placed through the other 5 mm port and used to retract the body and neck of the gallbladder. A dissector was placed through the epigastric port and using the electrocautery the peritoneal reflection at the gallbladder neck was opened. Blunt dissection was then carried out in this area until the gallbladder neck-cystic duct junction was readily identified and a good window was created. A single clip was placed on the gallbladder neck. A small  ductotomy was made just below the clip with laparoscopic scissors. A 14-gauge Angiocath was then placed through the anterior abdominal wall under direct vision. A Reddick cholangiogram catheter was then placed through the Angiocath and flushed. The catheter was then placed in the cystic duct and anchored in place with a clip. A cholangiogram was obtained that showed no filling defects, good emptying into the duodenum, and an adequate length on the cystic duct. The anchoring clip and catheters were then removed from the  patient. 3 clips were placed proximally on the cystic duct and the duct was divided between the 2 sets of clips. Posterior to this the cystic artery was identified and again dissected bluntly in a circumferential manner until a good window  was created. 2 clips were placed  proximally and one distally on the artery and the artery was divided between the 2 sets of clips. Next a laparoscopic hook cautery device was used to separate the gallbladder from the liver bed. Prior to completely detaching the gallbladder from the liver bed the liver bed was inspected and several small bleeding points were coagulated with the electrocautery until the area was completely hemostatic. The gallbladder was then detached the rest of it from the liver bed without difficulty. A laparoscopic bag was inserted through the hassan port. The laparoscope was moved to the epigastric port. The gallbladder was placed within the bag and the bag was sealed.  The bag with the gallbladder was then removed with the [facility] cannula through the infraumbilical port without difficulty. The fascial defect was then closed with the previously placed Vicryl pursestring stitch as well as with another figure-of-eight 0 Vicryl stitch. The liver bed was inspected again and found to be hemostatic. The abdomen was irrigated with copious amounts of saline until the effluent was clear. The ports were then removed under direct vision without difficulty and were found to be hemostatic. The gas was allowed to escape. No other abnormalities were noted on general inspection of the abdomen. The skin incisions were all closed with interrupted 4-0 Monocryl subcuticular stitches. Dermabond dressings were applied. The patient tolerated the procedure well. At the end of the case all needle sponge and instrument counts were correct. The patient was then awakened and taken to recovery in stable condition  PLAN OF CARE: Discharge to home after PACU  PATIENT DISPOSITION:  PACU - hemodynamically stable.   Delay start of Pharmacological VTE agent (>24hrs) due to surgical blood loss or risk of bleeding: not applicable

## 2022-08-11 NOTE — Transfer of Care (Signed)
Immediate Anesthesia Transfer of Care Note  Patient: [name]  Procedure(s) Performed: Procedure(s): LAPAROSCOPIC CHOLECYSTECTOMY WITH INTRAOPERATIVE CHOLANGIOGRAM (N/A)  Patient Location: PACU  Anesthesia Type:General  Level of Consciousness:  sedated, patient cooperative and responds to stimulation  Airway & Oxygen Therapy:Patient Spontanous Breathing and Patient connected to face mask oxgen  Post-op Assessment:  Report given to PACU RN and Post -op Vital signs reviewed and stable  Post vital signs:  Reviewed and stable  Last Vitals:  Vitals:   08/11/22 0634 08/11/22 0926  BP: 121/76 115/69  Pulse: 88 92  Resp: 16 17  Temp: 37.1 C 36.7 C  SpO2: 123XX123 [id number]    Complications: No apparent anesthesia complications

## 2022-08-12 ENCOUNTER — Encounter (HOSPITAL_COMMUNITY): Payer: Self-pay | Admitting: General Surgery

## 2022-08-12 ENCOUNTER — Other Ambulatory Visit (HOSPITAL_COMMUNITY): Payer: Self-pay

## 2022-08-12 LAB — SURGICAL PATHOLOGY

## 2022-08-15 ENCOUNTER — Other Ambulatory Visit: Payer: Self-pay

## 2022-08-15 ENCOUNTER — Other Ambulatory Visit (HOSPITAL_COMMUNITY): Payer: Self-pay

## 2022-08-18 ENCOUNTER — Other Ambulatory Visit (HOSPITAL_COMMUNITY): Payer: Self-pay

## 2022-08-19 ENCOUNTER — Other Ambulatory Visit (HOSPITAL_COMMUNITY): Payer: Self-pay

## 2022-08-26 DIAGNOSIS — M25571 Pain in right ankle and joints of right foot: Secondary | ICD-10-CM | POA: Diagnosis not present

## 2022-09-02 ENCOUNTER — Other Ambulatory Visit: Payer: Self-pay

## 2022-09-02 ENCOUNTER — Other Ambulatory Visit (HOSPITAL_COMMUNITY): Payer: Self-pay

## 2022-09-11 ENCOUNTER — Other Ambulatory Visit (HOSPITAL_COMMUNITY): Payer: Self-pay

## 2022-09-13 ENCOUNTER — Other Ambulatory Visit: Payer: Self-pay

## 2022-09-16 ENCOUNTER — Other Ambulatory Visit (HOSPITAL_COMMUNITY): Payer: Self-pay

## 2022-09-19 ENCOUNTER — Other Ambulatory Visit (HOSPITAL_COMMUNITY): Payer: Self-pay

## 2022-09-23 ENCOUNTER — Other Ambulatory Visit (HOSPITAL_COMMUNITY): Payer: Self-pay

## 2022-09-23 ENCOUNTER — Other Ambulatory Visit: Payer: Self-pay

## 2022-09-23 MED ORDER — AEROCHAMBER PLS FLOVU MTHPIECE DEVI
1 refills | Status: AC
  Filled 2022-09-23: qty 1, 30d supply, fill #0

## 2022-09-23 MED ORDER — ALBUTEROL SULFATE HFA 108 (90 BASE) MCG/ACT IN AERS
1.0000 | INHALATION_SPRAY | RESPIRATORY_TRACT | 0 refills | Status: DC | PRN
  Filled 2022-09-23: qty 6.7, 17d supply, fill #0

## 2022-09-26 DIAGNOSIS — M25571 Pain in right ankle and joints of right foot: Secondary | ICD-10-CM | POA: Diagnosis not present

## 2022-09-27 ENCOUNTER — Other Ambulatory Visit (HOSPITAL_COMMUNITY): Payer: Self-pay

## 2022-09-30 ENCOUNTER — Other Ambulatory Visit (HOSPITAL_COMMUNITY): Payer: Self-pay

## 2022-09-30 DIAGNOSIS — H1045 Other chronic allergic conjunctivitis: Secondary | ICD-10-CM | POA: Diagnosis not present

## 2022-09-30 DIAGNOSIS — J3089 Other allergic rhinitis: Secondary | ICD-10-CM | POA: Diagnosis not present

## 2022-09-30 DIAGNOSIS — J4530 Mild persistent asthma, uncomplicated: Secondary | ICD-10-CM | POA: Diagnosis not present

## 2022-09-30 DIAGNOSIS — J301 Allergic rhinitis due to pollen: Secondary | ICD-10-CM | POA: Diagnosis not present

## 2022-09-30 DIAGNOSIS — J209 Acute bronchitis, unspecified: Secondary | ICD-10-CM | POA: Diagnosis not present

## 2022-09-30 MED ORDER — AZITHROMYCIN 250 MG PO TABS
ORAL_TABLET | ORAL | 0 refills | Status: AC
  Filled 2022-09-30: qty 6, 5d supply, fill #0

## 2022-09-30 MED ORDER — PREDNISONE 10 MG (21) PO TBPK
ORAL_TABLET | ORAL | 0 refills | Status: DC
  Filled 2022-09-30: qty 21, 6d supply, fill #0

## 2022-10-10 DIAGNOSIS — R519 Headache, unspecified: Secondary | ICD-10-CM | POA: Diagnosis not present

## 2022-10-10 DIAGNOSIS — R030 Elevated blood-pressure reading, without diagnosis of hypertension: Secondary | ICD-10-CM | POA: Diagnosis not present

## 2022-10-10 DIAGNOSIS — J4530 Mild persistent asthma, uncomplicated: Secondary | ICD-10-CM | POA: Diagnosis not present

## 2022-10-10 DIAGNOSIS — E039 Hypothyroidism, unspecified: Secondary | ICD-10-CM | POA: Diagnosis not present

## 2022-10-10 DIAGNOSIS — G43909 Migraine, unspecified, not intractable, without status migrainosus: Secondary | ICD-10-CM | POA: Diagnosis not present

## 2022-10-17 ENCOUNTER — Other Ambulatory Visit (HOSPITAL_COMMUNITY): Payer: Self-pay

## 2022-10-18 ENCOUNTER — Other Ambulatory Visit (HOSPITAL_COMMUNITY): Payer: Self-pay

## 2022-10-19 ENCOUNTER — Other Ambulatory Visit (HOSPITAL_COMMUNITY): Payer: Self-pay

## 2022-10-20 DIAGNOSIS — D72829 Elevated white blood cell count, unspecified: Secondary | ICD-10-CM | POA: Diagnosis not present

## 2022-10-20 DIAGNOSIS — K5900 Constipation, unspecified: Secondary | ICD-10-CM | POA: Diagnosis not present

## 2022-10-20 DIAGNOSIS — R519 Headache, unspecified: Secondary | ICD-10-CM | POA: Diagnosis not present

## 2022-10-31 ENCOUNTER — Other Ambulatory Visit (HOSPITAL_COMMUNITY): Payer: Self-pay

## 2022-10-31 DIAGNOSIS — Z79891 Long term (current) use of opiate analgesic: Secondary | ICD-10-CM | POA: Diagnosis not present

## 2022-10-31 DIAGNOSIS — R102 Pelvic and perineal pain: Secondary | ICD-10-CM | POA: Diagnosis not present

## 2022-10-31 DIAGNOSIS — F1329 Sedative, hypnotic or anxiolytic dependence with unspecified sedative, hypnotic or anxiolytic-induced disorder: Secondary | ICD-10-CM | POA: Diagnosis not present

## 2022-10-31 DIAGNOSIS — N200 Calculus of kidney: Secondary | ICD-10-CM | POA: Diagnosis not present

## 2022-10-31 DIAGNOSIS — N3010 Interstitial cystitis (chronic) without hematuria: Secondary | ICD-10-CM | POA: Diagnosis not present

## 2022-10-31 DIAGNOSIS — M6289 Other specified disorders of muscle: Secondary | ICD-10-CM | POA: Diagnosis not present

## 2022-10-31 DIAGNOSIS — R350 Frequency of micturition: Secondary | ICD-10-CM | POA: Diagnosis not present

## 2022-10-31 DIAGNOSIS — R3915 Urgency of urination: Secondary | ICD-10-CM | POA: Diagnosis not present

## 2022-10-31 MED ORDER — POTASSIUM CITRATE ER 15 MEQ (1620 MG) PO TBCR
1.0000 | EXTENDED_RELEASE_TABLET | Freq: Two times a day (BID) | ORAL | 3 refills | Status: AC
  Filled 2022-10-31: qty 80, 40d supply, fill #0
  Filled 2022-11-02: qty 100, 50d supply, fill #0

## 2022-10-31 MED ORDER — ELMIRON 100 MG PO CAPS
200.0000 mg | ORAL_CAPSULE | Freq: Every day | ORAL | 11 refills | Status: AC
  Filled 2022-10-31: qty 60, 30d supply, fill #0

## 2022-10-31 MED ORDER — FLUCONAZOLE 150 MG PO TABS
150.0000 mg | ORAL_TABLET | ORAL | 1 refills | Status: DC
  Filled 2022-10-31: qty 2, 4d supply, fill #0
  Filled 2023-04-14: qty 2, 4d supply, fill #1

## 2022-10-31 MED ORDER — NITROFURANTOIN MACROCRYSTAL 50 MG PO CAPS
50.0000 mg | ORAL_CAPSULE | Freq: Every day | ORAL | 3 refills | Status: AC
  Filled 2022-10-31: qty 90, 90d supply, fill #0

## 2022-10-31 MED ORDER — MONTELUKAST SODIUM 10 MG PO TABS
10.0000 mg | ORAL_TABLET | Freq: Every evening | ORAL | 3 refills | Status: DC
  Filled 2022-10-31: qty 90, 90d supply, fill #0
  Filled 2023-03-17: qty 90, 90d supply, fill #1
  Filled 2023-06-30: qty 90, 90d supply, fill #2

## 2022-10-31 MED ORDER — OXYBUTYNIN CHLORIDE ER 10 MG PO TB24
10.0000 mg | ORAL_TABLET | Freq: Every day | ORAL | 3 refills | Status: AC
  Filled 2022-10-31: qty 90, 90d supply, fill #0

## 2022-10-31 MED ORDER — HYDROCODONE-ACETAMINOPHEN 7.5-325 MG PO TABS
1.0000 | ORAL_TABLET | Freq: Four times a day (QID) | ORAL | 0 refills | Status: AC
  Filled 2022-10-31 – 2022-11-16 (×2): qty 120, 30d supply, fill #0

## 2022-10-31 MED ORDER — HYDROCODONE-ACETAMINOPHEN 7.5-325 MG PO TABS
1.0000 | ORAL_TABLET | Freq: Four times a day (QID) | ORAL | 0 refills | Status: AC
  Filled 2022-10-31 – 2023-01-13 (×2): qty 120, 30d supply, fill #0

## 2022-10-31 MED ORDER — DIAZEPAM 10 MG PO TABS
10.0000 mg | ORAL_TABLET | Freq: Every day | ORAL | 5 refills | Status: DC
  Filled 2022-10-31: qty 30, 30d supply, fill #0
  Filled 2023-03-17: qty 30, 30d supply, fill #1

## 2022-10-31 MED ORDER — BUPIVACAINE HCL (PF) 0.5 % IJ SOLN
15.0000 mL | Freq: Every day | INTRAMUSCULAR | 99 refills | Status: AC
  Filled 2022-10-31 – 2022-11-01 (×2): qty 450, 30d supply, fill #0
  Filled 2022-11-01: qty 750, 25d supply, fill #0
  Filled 2022-11-01 (×2): qty 450, 30d supply, fill #0

## 2022-10-31 MED ORDER — HYDROCODONE-ACETAMINOPHEN 7.5-325 MG PO TABS
1.0000 | ORAL_TABLET | Freq: Four times a day (QID) | ORAL | 0 refills | Status: AC
  Filled 2022-10-31 – 2022-12-14 (×2): qty 120, 30d supply, fill #0

## 2022-10-31 MED ORDER — ONDANSETRON HCL 8 MG PO TABS
8.0000 mg | ORAL_TABLET | Freq: Three times a day (TID) | ORAL | 11 refills | Status: AC | PRN
  Filled 2022-10-31: qty 30, 10d supply, fill #0
  Filled 2023-01-13: qty 30, 10d supply, fill #1

## 2022-11-01 ENCOUNTER — Other Ambulatory Visit: Payer: Self-pay

## 2022-11-01 ENCOUNTER — Other Ambulatory Visit (HOSPITAL_COMMUNITY): Payer: Self-pay

## 2022-11-02 ENCOUNTER — Other Ambulatory Visit (HOSPITAL_COMMUNITY): Payer: Self-pay

## 2022-11-02 DIAGNOSIS — M25571 Pain in right ankle and joints of right foot: Secondary | ICD-10-CM | POA: Diagnosis not present

## 2022-11-09 DIAGNOSIS — M2011 Hallux valgus (acquired), right foot: Secondary | ICD-10-CM | POA: Diagnosis not present

## 2022-11-09 DIAGNOSIS — M2012 Hallux valgus (acquired), left foot: Secondary | ICD-10-CM | POA: Diagnosis not present

## 2022-11-11 ENCOUNTER — Other Ambulatory Visit (HOSPITAL_COMMUNITY): Payer: Self-pay

## 2022-11-16 ENCOUNTER — Other Ambulatory Visit (HOSPITAL_COMMUNITY): Payer: Self-pay

## 2022-11-24 DIAGNOSIS — Z1231 Encounter for screening mammogram for malignant neoplasm of breast: Secondary | ICD-10-CM | POA: Diagnosis not present

## 2022-12-13 ENCOUNTER — Other Ambulatory Visit: Payer: Self-pay

## 2022-12-13 ENCOUNTER — Other Ambulatory Visit (HOSPITAL_COMMUNITY): Payer: Self-pay

## 2022-12-13 MED ORDER — ALBUTEROL SULFATE HFA 108 (90 BASE) MCG/ACT IN AERS
2.0000 | INHALATION_SPRAY | RESPIRATORY_TRACT | 0 refills | Status: DC
  Filled 2022-12-13: qty 6.7, 17d supply, fill #0

## 2022-12-14 ENCOUNTER — Other Ambulatory Visit (HOSPITAL_COMMUNITY): Payer: Self-pay

## 2022-12-14 MED ORDER — TOPIRAMATE 50 MG PO TABS
150.0000 mg | ORAL_TABLET | Freq: Every day | ORAL | 0 refills | Status: DC
  Filled 2022-12-14: qty 90, 30d supply, fill #0

## 2022-12-21 ENCOUNTER — Other Ambulatory Visit: Payer: Self-pay | Admitting: Oncology

## 2022-12-21 DIAGNOSIS — Z006 Encounter for examination for normal comparison and control in clinical research program: Secondary | ICD-10-CM

## 2023-01-13 ENCOUNTER — Other Ambulatory Visit (HOSPITAL_COMMUNITY): Payer: Self-pay

## 2023-01-20 ENCOUNTER — Other Ambulatory Visit (HOSPITAL_COMMUNITY): Payer: Self-pay

## 2023-01-31 ENCOUNTER — Other Ambulatory Visit: Payer: Self-pay

## 2023-01-31 ENCOUNTER — Other Ambulatory Visit (HOSPITAL_COMMUNITY): Payer: Self-pay

## 2023-01-31 DIAGNOSIS — N3010 Interstitial cystitis (chronic) without hematuria: Secondary | ICD-10-CM | POA: Diagnosis not present

## 2023-01-31 DIAGNOSIS — R350 Frequency of micturition: Secondary | ICD-10-CM | POA: Diagnosis not present

## 2023-01-31 DIAGNOSIS — M6289 Other specified disorders of muscle: Secondary | ICD-10-CM | POA: Diagnosis not present

## 2023-01-31 DIAGNOSIS — R110 Nausea: Secondary | ICD-10-CM | POA: Diagnosis not present

## 2023-01-31 DIAGNOSIS — Z79891 Long term (current) use of opiate analgesic: Secondary | ICD-10-CM | POA: Diagnosis not present

## 2023-01-31 DIAGNOSIS — R102 Pelvic and perineal pain: Secondary | ICD-10-CM | POA: Diagnosis not present

## 2023-01-31 DIAGNOSIS — R3915 Urgency of urination: Secondary | ICD-10-CM | POA: Diagnosis not present

## 2023-01-31 DIAGNOSIS — N200 Calculus of kidney: Secondary | ICD-10-CM | POA: Diagnosis not present

## 2023-01-31 DIAGNOSIS — B379 Candidiasis, unspecified: Secondary | ICD-10-CM | POA: Diagnosis not present

## 2023-01-31 DIAGNOSIS — T3695XA Adverse effect of unspecified systemic antibiotic, initial encounter: Secondary | ICD-10-CM | POA: Diagnosis not present

## 2023-01-31 MED ORDER — AMITRIPTYLINE HCL 25 MG PO TABS
25.0000 mg | ORAL_TABLET | Freq: Every day | ORAL | 0 refills | Status: AC
  Filled 2023-01-31: qty 90, 90d supply, fill #0

## 2023-01-31 MED ORDER — TAMSULOSIN HCL 0.4 MG PO CAPS
0.4000 mg | ORAL_CAPSULE | Freq: Every day | ORAL | 99 refills | Status: AC | PRN
  Filled 2023-01-31: qty 30, 30d supply, fill #0

## 2023-01-31 MED ORDER — POTASSIUM CITRATE ER 15 MEQ (1620 MG) PO TBCR
1.0000 | EXTENDED_RELEASE_TABLET | Freq: Two times a day (BID) | ORAL | 3 refills | Status: AC
  Filled 2023-01-31: qty 180, 90d supply, fill #0

## 2023-01-31 MED ORDER — HYDROCODONE-ACETAMINOPHEN 7.5-325 MG PO TABS
1.0000 | ORAL_TABLET | Freq: Four times a day (QID) | ORAL | 0 refills | Status: AC | PRN
  Filled 2023-01-31 – 2023-03-15 (×3): qty 120, 30d supply, fill #0

## 2023-01-31 MED ORDER — HYDROCODONE-ACETAMINOPHEN 7.5-325 MG PO TABS
1.0000 | ORAL_TABLET | Freq: Four times a day (QID) | ORAL | 0 refills | Status: AC | PRN
  Filled 2023-02-15: qty 120, 30d supply, fill #0

## 2023-01-31 MED ORDER — ONDANSETRON 8 MG PO TBDP
8.0000 mg | ORAL_TABLET | Freq: Three times a day (TID) | ORAL | 12 refills | Status: DC | PRN
  Filled 2023-01-31: qty 30, 10d supply, fill #0
  Filled 2023-02-23: qty 30, 10d supply, fill #1
  Filled 2023-05-19: qty 20, 7d supply, fill #2
  Filled 2023-05-19: qty 30, 10d supply, fill #2
  Filled 2023-05-19: qty 10, 3d supply, fill #2
  Filled 2023-07-07: qty 30, 10d supply, fill #0
  Filled 2023-08-01: qty 30, 10d supply, fill #1
  Filled 2023-08-24: qty 30, 10d supply, fill #2
  Filled 2023-09-01: qty 30, 10d supply, fill #3
  Filled 2023-10-19: qty 30, 10d supply, fill #4
  Filled 2023-11-28: qty 30, 10d supply, fill #5
  Filled 2023-12-26: qty 30, 10d supply, fill #6
  Filled 2024-01-24 (×2): qty 30, 10d supply, fill #7

## 2023-01-31 MED ORDER — FLUCONAZOLE 150 MG PO TABS
150.0000 mg | ORAL_TABLET | ORAL | 1 refills | Status: AC
  Filled 2023-01-31: qty 2, 3d supply, fill #0
  Filled 2023-06-02: qty 2, 3d supply, fill #1

## 2023-01-31 MED ORDER — HYDROCODONE-ACETAMINOPHEN 7.5-325 MG PO TABS
1.0000 | ORAL_TABLET | Freq: Four times a day (QID) | ORAL | 0 refills | Status: AC | PRN
  Filled 2023-01-31 – 2023-04-14 (×5): qty 120, 30d supply, fill #0

## 2023-02-10 ENCOUNTER — Other Ambulatory Visit (HOSPITAL_COMMUNITY): Payer: Self-pay

## 2023-02-15 ENCOUNTER — Other Ambulatory Visit: Payer: Self-pay

## 2023-02-15 ENCOUNTER — Other Ambulatory Visit (HOSPITAL_COMMUNITY): Payer: Self-pay

## 2023-02-16 ENCOUNTER — Other Ambulatory Visit (HOSPITAL_COMMUNITY): Payer: Self-pay

## 2023-02-16 ENCOUNTER — Telehealth: Payer: Commercial Managed Care - PPO | Admitting: Physician Assistant

## 2023-02-16 ENCOUNTER — Other Ambulatory Visit (HOSPITAL_BASED_OUTPATIENT_CLINIC_OR_DEPARTMENT_OTHER): Payer: Self-pay

## 2023-02-16 DIAGNOSIS — J4531 Mild persistent asthma with (acute) exacerbation: Secondary | ICD-10-CM

## 2023-02-16 MED ORDER — BENZONATATE 100 MG PO CAPS
100.0000 mg | ORAL_CAPSULE | Freq: Three times a day (TID) | ORAL | 0 refills | Status: AC | PRN
Start: 2023-02-16 — End: ?
  Filled 2023-02-16: qty 30, 10d supply, fill #0

## 2023-02-16 MED ORDER — PREDNISONE 20 MG PO TABS
40.0000 mg | ORAL_TABLET | Freq: Every day | ORAL | 0 refills | Status: DC
Start: 2023-02-16 — End: 2023-05-13
  Filled 2023-02-16: qty 10, 5d supply, fill #0

## 2023-02-16 NOTE — Progress Notes (Signed)
I have spent 5 minutes in review of e-visit questionnaire, review and updating patient chart, medical decision making and response to patient.   William Cody Martin, PA-C    

## 2023-02-16 NOTE — Progress Notes (Signed)
E-Visit for Asthma  Based on what you have shared with me, it looks like you may have a flare up of your asthma.  Asthma is a chronic (ongoing) lung disease which results in airway obstruction, inflammation and hyper-responsiveness.   Asthma symptoms vary from person to person, with common symptoms including nighttime awakening and decreased ability to participate in normal activities as a result of shortness of breath. It is often triggered by changes in weather, changes in the season, changes in air temperature, or inside (home, school, daycare or work) allergens such as animal dander, mold, mildew, woodstoves or cockroaches.   It can also be triggered by hormonal changes, extreme emotion, physical exertion or an upper respiratory tract illness.     It is important to identify the trigger, and then eliminate or avoid the trigger if possible.   If you have been prescribed medications to be taken on a regular basis, it is important to follow the asthma action plan and to follow guidelines to adjust medication in response to increasing symptoms of decreased peak expiratory flow rate  Treatment: I have prescribed: Prednisone 40mg  by mouth per day for 5 - 7 days. I have also sent in a prescription cough medication to use as directed in addition to your regular asthma inhalers/medications.  HOME CARE Only take medications as instructed by your medical team. Consider wearing a mask or scarf to improve breathing air temperature have been shown to decrease irritation and decrease exacerbations Get rest. Taking a steamy shower or using a humidifier may help nasal congestion sand ease sore throat pain. You can place a towel over your head and breathe in the steam from hot water coming from a faucet. Using a saline nasal spray works much the same way.  Cough drops, hare candies and sore throat  lozenges may ease your cough.  Avoid close contacts especially the very you and the elderly Cover your mouth if you cough or sneeze Always remember to wash your hands.    GET HELP RIGHT AWAY IF: You develop worsening symptoms; breathlessness at rest, drowsy, confused or agitated, unable to speak in full sentences You have coughing fits You develop a severe headache or visual changes You develop shortness of breath, difficulty breathing or start having chest pain Your symptoms persist after you have completed your treatment plan If your symptoms do not improve within 10 days  MAKE SURE YOU Understand these instructions. Will watch your condition. Will get help right away if you are not doing well or get worse.   Your e-visit answers were reviewed by a board certified advanced clinical practitioner to complete your personal care plan, Depending upon the condition, your plan could have included both over the counter or prescription medications.   Please review your pharmacy choice. Your safety is important to [place]. If you have drug allergies check your prescription carefully.  You can use MyChart to ask questions about today's visit, request a non-urgent  call back, or ask for a work or school excuse for 24 hours related to this e-Visit. If it has been greater than 24 hours you will need to follow up with your provider, or enter a new e-Visit to address those concerns.   You will get an e-mail in the next two days asking about your experience. I hope that your e-visit has been valuable and will speed your recovery. Thank you for using e-visits.

## 2023-02-23 ENCOUNTER — Other Ambulatory Visit (HOSPITAL_COMMUNITY): Payer: Self-pay

## 2023-02-24 ENCOUNTER — Other Ambulatory Visit: Payer: Self-pay

## 2023-02-24 ENCOUNTER — Other Ambulatory Visit (HOSPITAL_COMMUNITY): Payer: Self-pay

## 2023-02-24 MED ORDER — HYDROCHLOROTHIAZIDE 50 MG PO TABS
50.0000 mg | ORAL_TABLET | Freq: Every day | ORAL | 11 refills | Status: DC
  Filled 2023-02-24: qty 30, 30d supply, fill #0

## 2023-02-24 MED ORDER — ALBUTEROL SULFATE HFA 108 (90 BASE) MCG/ACT IN AERS
INHALATION_SPRAY | RESPIRATORY_TRACT | 0 refills | Status: AC
  Filled 2023-02-24: qty 6.7, 20d supply, fill #0

## 2023-02-24 MED ORDER — BUDESONIDE-FORMOTEROL FUMARATE 160-4.5 MCG/ACT IN AERO
2.0000 | INHALATION_SPRAY | Freq: Two times a day (BID) | RESPIRATORY_TRACT | 6 refills | Status: AC
  Filled 2023-02-24: qty 10.2, 30d supply, fill #0
  Filled 2023-05-19 – 2023-09-29 (×2): qty 10.2, 30d supply, fill #1

## 2023-03-14 ENCOUNTER — Other Ambulatory Visit (HOSPITAL_COMMUNITY): Payer: Self-pay

## 2023-03-14 DIAGNOSIS — E7800 Pure hypercholesterolemia, unspecified: Secondary | ICD-10-CM | POA: Diagnosis not present

## 2023-03-14 DIAGNOSIS — Z0000 Encounter for general adult medical examination without abnormal findings: Secondary | ICD-10-CM | POA: Diagnosis not present

## 2023-03-14 DIAGNOSIS — M2550 Pain in unspecified joint: Secondary | ICD-10-CM | POA: Diagnosis not present

## 2023-03-14 DIAGNOSIS — G43909 Migraine, unspecified, not intractable, without status migrainosus: Secondary | ICD-10-CM | POA: Diagnosis not present

## 2023-03-14 DIAGNOSIS — K219 Gastro-esophageal reflux disease without esophagitis: Secondary | ICD-10-CM | POA: Diagnosis not present

## 2023-03-14 DIAGNOSIS — F419 Anxiety disorder, unspecified: Secondary | ICD-10-CM | POA: Diagnosis not present

## 2023-03-14 DIAGNOSIS — E039 Hypothyroidism, unspecified: Secondary | ICD-10-CM | POA: Diagnosis not present

## 2023-03-14 DIAGNOSIS — N3010 Interstitial cystitis (chronic) without hematuria: Secondary | ICD-10-CM | POA: Diagnosis not present

## 2023-03-14 DIAGNOSIS — J4521 Mild intermittent asthma with (acute) exacerbation: Secondary | ICD-10-CM | POA: Diagnosis not present

## 2023-03-14 DIAGNOSIS — N200 Calculus of kidney: Secondary | ICD-10-CM | POA: Diagnosis not present

## 2023-03-14 MED ORDER — BUPROPION HCL ER (XL) 150 MG PO TB24
450.0000 mg | ORAL_TABLET | Freq: Every day | ORAL | 3 refills | Status: DC
  Filled 2023-03-14: qty 270, 90d supply, fill #0
  Filled 2024-01-24: qty 270, 90d supply, fill #1
  Filled 2024-02-08: qty 270, 90d supply, fill #2

## 2023-03-14 MED ORDER — TOPIRAMATE 50 MG PO TABS
150.0000 mg | ORAL_TABLET | Freq: Every day | ORAL | 3 refills | Status: DC
  Filled 2023-03-14: qty 270, 90d supply, fill #0
  Filled 2024-02-08: qty 270, 90d supply, fill #1

## 2023-03-14 MED ORDER — ALBUTEROL SULFATE HFA 108 (90 BASE) MCG/ACT IN AERS
2.0000 | INHALATION_SPRAY | Freq: Four times a day (QID) | RESPIRATORY_TRACT | 5 refills | Status: AC | PRN
  Filled 2023-03-14: qty 6.7, 25d supply, fill #0
  Filled 2023-06-02: qty 6.7, 25d supply, fill #1
  Filled 2023-07-07: qty 6.7, 25d supply, fill #2
  Filled 2023-10-19: qty 6.7, 25d supply, fill #3
  Filled 2023-12-26: qty 6.7, 25d supply, fill #4
  Filled 2024-02-08: qty 6.7, 25d supply, fill #5

## 2023-03-14 MED ORDER — BUSPIRONE HCL 10 MG PO TABS
5.0000 mg | ORAL_TABLET | Freq: Two times a day (BID) | ORAL | 3 refills | Status: AC | PRN
  Filled 2023-03-14: qty 180, 90d supply, fill #0
  Filled 2023-10-19: qty 180, 90d supply, fill #1
  Filled 2024-02-08: qty 180, 90d supply, fill #2

## 2023-03-15 ENCOUNTER — Other Ambulatory Visit (HOSPITAL_COMMUNITY): Payer: Self-pay

## 2023-03-15 DIAGNOSIS — E7800 Pure hypercholesterolemia, unspecified: Secondary | ICD-10-CM | POA: Diagnosis not present

## 2023-03-15 DIAGNOSIS — M2550 Pain in unspecified joint: Secondary | ICD-10-CM | POA: Diagnosis not present

## 2023-03-17 ENCOUNTER — Other Ambulatory Visit: Payer: Self-pay

## 2023-03-22 ENCOUNTER — Other Ambulatory Visit (HOSPITAL_COMMUNITY): Payer: Self-pay

## 2023-03-22 DIAGNOSIS — Z01419 Encounter for gynecological examination (general) (routine) without abnormal findings: Secondary | ICD-10-CM | POA: Diagnosis not present

## 2023-03-22 DIAGNOSIS — Z1331 Encounter for screening for depression: Secondary | ICD-10-CM | POA: Diagnosis not present

## 2023-03-22 DIAGNOSIS — Z113 Encounter for screening for infections with a predominantly sexual mode of transmission: Secondary | ICD-10-CM | POA: Diagnosis not present

## 2023-03-22 DIAGNOSIS — Z01411 Encounter for gynecological examination (general) (routine) with abnormal findings: Secondary | ICD-10-CM | POA: Diagnosis not present

## 2023-03-22 DIAGNOSIS — Z124 Encounter for screening for malignant neoplasm of cervix: Secondary | ICD-10-CM | POA: Diagnosis not present

## 2023-03-22 MED ORDER — VALACYCLOVIR HCL 500 MG PO TABS
500.0000 mg | ORAL_TABLET | Freq: Every day | ORAL | 3 refills | Status: AC
  Filled 2023-03-22: qty 90, 90d supply, fill #0
  Filled 2024-01-24 (×2): qty 90, 90d supply, fill #1

## 2023-03-22 MED ORDER — PHENTERMINE HCL 37.5 MG PO TABS
37.5000 mg | ORAL_TABLET | Freq: Every day | ORAL | 2 refills | Status: AC
Start: 2023-03-22 — End: ?
  Filled 2023-03-22: qty 30, 30d supply, fill #0

## 2023-03-27 ENCOUNTER — Other Ambulatory Visit (HOSPITAL_COMMUNITY): Payer: Self-pay

## 2023-03-27 MED ORDER — LEVOTHYROXINE SODIUM 150 MCG PO TABS
150.0000 ug | ORAL_TABLET | Freq: Every morning | ORAL | 3 refills | Status: DC
  Filled 2023-03-27: qty 90, 90d supply, fill #0
  Filled 2023-08-01: qty 90, 90d supply, fill #1
  Filled 2023-12-26: qty 90, 90d supply, fill #2

## 2023-04-03 DIAGNOSIS — J4530 Mild persistent asthma, uncomplicated: Secondary | ICD-10-CM | POA: Diagnosis not present

## 2023-04-03 DIAGNOSIS — J3089 Other allergic rhinitis: Secondary | ICD-10-CM | POA: Diagnosis not present

## 2023-04-03 DIAGNOSIS — J301 Allergic rhinitis due to pollen: Secondary | ICD-10-CM | POA: Diagnosis not present

## 2023-04-03 DIAGNOSIS — H1045 Other chronic allergic conjunctivitis: Secondary | ICD-10-CM | POA: Diagnosis not present

## 2023-04-04 ENCOUNTER — Other Ambulatory Visit (HOSPITAL_COMMUNITY): Payer: Self-pay

## 2023-04-04 MED ORDER — BUDESONIDE-FORMOTEROL FUMARATE 160-4.5 MCG/ACT IN AERO
INHALATION_SPRAY | RESPIRATORY_TRACT | 6 refills | Status: DC
  Filled 2023-04-04: qty 10.2, 30d supply, fill #0
  Filled 2023-05-19: qty 10.2, 30d supply, fill #1
  Filled 2023-06-30: qty 10.2, 30d supply, fill #2
  Filled 2023-08-24: qty 10.2, 30d supply, fill #3
  Filled 2023-11-28: qty 10.2, 30d supply, fill #4
  Filled 2024-01-24: qty 10.2, 30d supply, fill #5
  Filled 2024-03-20: qty 10.2, 30d supply, fill #6

## 2023-04-04 MED ORDER — ALBUTEROL SULFATE HFA 108 (90 BASE) MCG/ACT IN AERS
INHALATION_SPRAY | RESPIRATORY_TRACT | 0 refills | Status: AC
  Filled 2023-04-04: qty 6.7, 20d supply, fill #0

## 2023-04-04 MED ORDER — TIOTROPIUM BROMIDE MONOHYDRATE 18 MCG IN CAPS
ORAL_CAPSULE | RESPIRATORY_TRACT | 6 refills | Status: AC
  Filled 2023-04-04: qty 30, 30d supply, fill #0

## 2023-04-08 ENCOUNTER — Other Ambulatory Visit (HOSPITAL_COMMUNITY): Payer: Self-pay

## 2023-04-13 ENCOUNTER — Other Ambulatory Visit (HOSPITAL_COMMUNITY): Payer: Self-pay

## 2023-04-14 ENCOUNTER — Other Ambulatory Visit (HOSPITAL_COMMUNITY): Payer: Self-pay

## 2023-05-03 ENCOUNTER — Other Ambulatory Visit (HOSPITAL_COMMUNITY): Payer: Self-pay

## 2023-05-03 DIAGNOSIS — T3695XA Adverse effect of unspecified systemic antibiotic, initial encounter: Secondary | ICD-10-CM | POA: Diagnosis not present

## 2023-05-03 DIAGNOSIS — N3010 Interstitial cystitis (chronic) without hematuria: Secondary | ICD-10-CM | POA: Diagnosis not present

## 2023-05-03 DIAGNOSIS — M6289 Other specified disorders of muscle: Secondary | ICD-10-CM | POA: Diagnosis not present

## 2023-05-03 DIAGNOSIS — R102 Pelvic and perineal pain: Secondary | ICD-10-CM | POA: Diagnosis not present

## 2023-05-03 DIAGNOSIS — B379 Candidiasis, unspecified: Secondary | ICD-10-CM | POA: Diagnosis not present

## 2023-05-03 MED ORDER — HYDROCODONE-ACETAMINOPHEN 7.5-325 MG PO TABS
1.0000 | ORAL_TABLET | Freq: Four times a day (QID) | ORAL | 0 refills | Status: AC | PRN
  Filled 2023-05-15: qty 120, 30d supply, fill #0

## 2023-05-03 MED ORDER — HYDROCODONE-ACETAMINOPHEN 7.5-325 MG PO TABS
1.0000 | ORAL_TABLET | Freq: Four times a day (QID) | ORAL | 0 refills | Status: AC | PRN
  Filled 2023-06-15 (×2): qty 120, 30d supply, fill #0

## 2023-05-03 MED ORDER — HYDROCODONE-ACETAMINOPHEN 7.5-325 MG PO TABS
1.0000 | ORAL_TABLET | Freq: Four times a day (QID) | ORAL | 0 refills | Status: DC | PRN
  Filled 2023-07-13: qty 120, 30d supply, fill #0

## 2023-05-03 MED ORDER — DIAZEPAM 10 MG PO TABS
10.0000 mg | ORAL_TABLET | Freq: Every day | ORAL | 5 refills | Status: AC
  Filled 2023-05-03 – 2023-06-02 (×3): qty 30, 30d supply, fill #0
  Filled 2023-08-24 – 2023-09-08 (×2): qty 30, 30d supply, fill #1

## 2023-05-03 MED ORDER — HYDROXYZINE HCL 25 MG PO TABS
25.0000 mg | ORAL_TABLET | Freq: Every evening | ORAL | 3 refills | Status: AC
  Filled 2023-05-03: qty 360, 90d supply, fill #0

## 2023-05-03 MED ORDER — HYDROCHLOROTHIAZIDE 50 MG PO TABS
50.0000 mg | ORAL_TABLET | Freq: Every day | ORAL | 11 refills | Status: AC
  Filled 2023-05-03 – 2023-05-19 (×2): qty 30, 30d supply, fill #0
  Filled 2023-06-22: qty 30, 30d supply, fill #1
  Filled 2023-08-11: qty 30, 30d supply, fill #2
  Filled 2023-09-29: qty 30, 30d supply, fill #3
  Filled 2023-12-16: qty 30, 30d supply, fill #4
  Filled 2024-02-08: qty 30, 30d supply, fill #5
  Filled 2024-04-03: qty 30, 30d supply, fill #6

## 2023-05-03 MED ORDER — FLUCONAZOLE 150 MG PO TABS
150.0000 mg | ORAL_TABLET | Freq: Once | ORAL | 0 refills | Status: AC
  Filled 2023-05-03: qty 2, 2d supply, fill #0

## 2023-05-04 ENCOUNTER — Other Ambulatory Visit: Payer: Self-pay

## 2023-05-04 ENCOUNTER — Other Ambulatory Visit (HOSPITAL_COMMUNITY): Payer: Self-pay

## 2023-05-10 DIAGNOSIS — M79644 Pain in right finger(s): Secondary | ICD-10-CM | POA: Diagnosis not present

## 2023-05-13 ENCOUNTER — Other Ambulatory Visit: Payer: Self-pay

## 2023-05-13 ENCOUNTER — Other Ambulatory Visit (HOSPITAL_COMMUNITY): Payer: Self-pay

## 2023-05-13 ENCOUNTER — Telehealth: Payer: Commercial Managed Care - PPO | Admitting: Family Medicine

## 2023-05-13 DIAGNOSIS — J4531 Mild persistent asthma with (acute) exacerbation: Secondary | ICD-10-CM | POA: Diagnosis not present

## 2023-05-13 MED ORDER — PREDNISONE 20 MG PO TABS
40.0000 mg | ORAL_TABLET | Freq: Every day | ORAL | 0 refills | Status: DC
  Filled 2023-05-13: qty 10, 5d supply, fill #0

## 2023-05-13 MED ORDER — ALBUTEROL SULFATE HFA 108 (90 BASE) MCG/ACT IN AERS
2.0000 | INHALATION_SPRAY | Freq: Four times a day (QID) | RESPIRATORY_TRACT | 0 refills | Status: AC | PRN
Start: 2023-05-13 — End: ?
  Filled 2023-05-13: qty 6.7, 25d supply, fill #0
  Filled 2023-08-24 – 2023-10-19 (×2): qty 6.7, 25d supply, fill #1

## 2023-05-13 NOTE — Progress Notes (Signed)

## 2023-05-15 ENCOUNTER — Other Ambulatory Visit (HOSPITAL_COMMUNITY): Payer: Self-pay

## 2023-05-15 DIAGNOSIS — J301 Allergic rhinitis due to pollen: Secondary | ICD-10-CM | POA: Diagnosis not present

## 2023-05-15 DIAGNOSIS — J3089 Other allergic rhinitis: Secondary | ICD-10-CM | POA: Diagnosis not present

## 2023-05-15 DIAGNOSIS — J209 Acute bronchitis, unspecified: Secondary | ICD-10-CM | POA: Diagnosis not present

## 2023-05-15 DIAGNOSIS — H1045 Other chronic allergic conjunctivitis: Secondary | ICD-10-CM | POA: Diagnosis not present

## 2023-05-15 MED ORDER — AZITHROMYCIN 250 MG PO TABS
ORAL_TABLET | ORAL | 0 refills | Status: DC
  Filled 2023-05-15: qty 6, 5d supply, fill #0

## 2023-05-15 MED ORDER — PREDNISONE 10 MG (21) PO TBPK
ORAL_TABLET | ORAL | 0 refills | Status: DC
  Filled 2023-05-15: qty 21, 6d supply, fill #0

## 2023-05-19 ENCOUNTER — Other Ambulatory Visit: Payer: Self-pay

## 2023-05-19 ENCOUNTER — Other Ambulatory Visit (HOSPITAL_COMMUNITY): Payer: Self-pay

## 2023-05-19 MED ORDER — LIDOCAINE VISCOUS HCL 2 % MT SOLN
OROMUCOSAL | 1 refills | Status: AC
  Filled 2023-05-19: qty 120, 2d supply, fill #0
  Filled 2024-01-24 (×2): qty 120, 2d supply, fill #1

## 2023-05-22 ENCOUNTER — Other Ambulatory Visit: Payer: Self-pay

## 2023-05-26 ENCOUNTER — Other Ambulatory Visit (HOSPITAL_BASED_OUTPATIENT_CLINIC_OR_DEPARTMENT_OTHER): Payer: Self-pay

## 2023-05-26 MED ORDER — NYSTATIN 100000 UNIT/ML MT SUSP
OROMUCOSAL | 0 refills | Status: AC
  Filled 2023-05-26: qty 250, 8d supply, fill #0

## 2023-05-29 ENCOUNTER — Other Ambulatory Visit (HOSPITAL_COMMUNITY): Payer: Self-pay

## 2023-06-01 ENCOUNTER — Other Ambulatory Visit (HOSPITAL_COMMUNITY): Payer: Self-pay

## 2023-06-02 ENCOUNTER — Other Ambulatory Visit: Payer: Self-pay

## 2023-06-02 ENCOUNTER — Other Ambulatory Visit (HOSPITAL_COMMUNITY): Payer: Self-pay

## 2023-06-06 ENCOUNTER — Other Ambulatory Visit (HOSPITAL_COMMUNITY): Payer: Self-pay

## 2023-06-15 ENCOUNTER — Other Ambulatory Visit (HOSPITAL_COMMUNITY): Payer: Self-pay

## 2023-06-19 DIAGNOSIS — H5213 Myopia, bilateral: Secondary | ICD-10-CM | POA: Diagnosis not present

## 2023-06-22 ENCOUNTER — Other Ambulatory Visit (HOSPITAL_COMMUNITY): Payer: Self-pay

## 2023-06-30 DIAGNOSIS — E039 Hypothyroidism, unspecified: Secondary | ICD-10-CM | POA: Diagnosis not present

## 2023-07-07 ENCOUNTER — Other Ambulatory Visit (HOSPITAL_COMMUNITY): Payer: Self-pay

## 2023-07-09 ENCOUNTER — Telehealth: Payer: Commercial Managed Care - PPO | Admitting: Family Medicine

## 2023-07-09 DIAGNOSIS — J4531 Mild persistent asthma with (acute) exacerbation: Secondary | ICD-10-CM | POA: Diagnosis not present

## 2023-07-09 DIAGNOSIS — J069 Acute upper respiratory infection, unspecified: Secondary | ICD-10-CM | POA: Diagnosis not present

## 2023-07-09 DIAGNOSIS — J4540 Moderate persistent asthma, uncomplicated: Secondary | ICD-10-CM

## 2023-07-09 MED ORDER — BENZONATATE 200 MG PO CAPS: 200.0000 mg | ORAL_CAPSULE | Freq: Two times a day (BID) | ORAL | 0 refills | Status: DC | PRN

## 2023-07-09 MED ORDER — PREDNISONE 10 MG (21) PO TBPK: ORAL_TABLET | ORAL | 0 refills | Status: DC

## 2023-07-09 NOTE — Progress Notes (Signed)
E-Visit for Cough   We are sorry that you are not feeling well.  Here is how we plan to help!  Based on your presentation I believe you most likely have A cough due to a virus.  This is called viral bronchitis and is best treated by rest, plenty of fluids and control of the cough.  You may use Ibuprofen or Tylenol as directed to help your symptoms.     In addition you may use A prescription cough medication called Tessalon Perles 100mg . You may take 1-2 capsules every 8 hours as needed for your cough.  Prednisone 10 mg daily for 6 days (see taper instructions below)  From your responses in the eVisit questionnaire you describe inflammation in the upper respiratory tract which is causing a significant cough.  This is commonly called Bronchitis and has four common causes:   Allergies Viral Infections Acid Reflux Bacterial Infection Allergies, viruses and acid reflux are treated by controlling symptoms or eliminating the cause. An example might be a cough caused by taking certain blood pressure medications. You stop the cough by changing the medication. Another example might be a cough caused by acid reflux. Controlling the reflux helps control the cough.  USE OF BRONCHODILATOR ("RESCUE") INHALERS: There is a risk from using your bronchodilator too frequently.  The risk is that over-reliance on a medication which only relaxes the muscles surrounding the breathing tubes can reduce the effectiveness of medications prescribed to reduce swelling and congestion of the tubes themselves.  Although you feel brief relief from the bronchodilator inhaler, your asthma may actually be worsening with the tubes becoming more swollen and filled with mucus.  This can delay other crucial treatments, such as oral steroid medications. If you need to use a bronchodilator inhaler daily, several times per day, you should discuss this with your provider.  There are probably better treatments that could be used to keep your  asthma under control.     HOME CARE Only take medications as instructed by your medical team. Complete the entire course of an antibiotic. Drink plenty of fluids and get plenty of rest. Avoid close contacts especially the very young and the elderly Cover your mouth if you cough or cough into your sleeve. Always remember to wash your hands A steam or ultrasonic humidifier can help congestion.   GET HELP RIGHT AWAY IF: You develop worsening fever. You become short of breath You cough up blood. Your symptoms persist after you have completed your treatment plan MAKE SURE YOU  Understand these instructions. Will watch your condition. Will get help right away if you are not doing well or get worse.    Thank you for choosing an e-visit.  Your e-visit answers were reviewed by a board certified advanced clinical practitioner to complete your personal care plan. Depending upon the condition, your plan could have included both over the counter or prescription medications.  Please review your pharmacy choice. Make sure the pharmacy is open so you can pick up prescription now. If there is a problem, you may contact your provider through [employer] and have the prescription routed to another pharmacy.  Your safety is important to [place]. If you have drug allergies check your prescription carefully.   For the next 24 hours you can use MyChart to ask questions about today's visit, request a non-urgent call back, or ask for a work or school excuse. You will get an email in the next two days asking about your experience. I hope  that your e-visit has been valuable and will speed your recovery.    have provided 5 minutes of non face to face time during this encounter for chart review and documentation.

## 2023-07-10 ENCOUNTER — Other Ambulatory Visit (HOSPITAL_COMMUNITY): Payer: Self-pay

## 2023-07-10 ENCOUNTER — Other Ambulatory Visit: Payer: Self-pay

## 2023-07-10 DIAGNOSIS — J301 Allergic rhinitis due to pollen: Secondary | ICD-10-CM | POA: Diagnosis not present

## 2023-07-10 DIAGNOSIS — J4540 Moderate persistent asthma, uncomplicated: Secondary | ICD-10-CM | POA: Diagnosis not present

## 2023-07-10 DIAGNOSIS — J3089 Other allergic rhinitis: Secondary | ICD-10-CM | POA: Diagnosis not present

## 2023-07-10 DIAGNOSIS — H1045 Other chronic allergic conjunctivitis: Secondary | ICD-10-CM | POA: Diagnosis not present

## 2023-07-10 MED ORDER — EPINEPHRINE 0.3 MG/0.3ML IJ SOAJ
INTRAMUSCULAR | 1 refills | Status: AC
  Filled 2023-07-10: qty 2, 7d supply, fill #0

## 2023-07-10 MED ORDER — PREDNISONE 10 MG (21) PO TBPK
ORAL_TABLET | ORAL | 0 refills | Status: DC
  Filled 2023-07-10 (×2): qty 21, 6d supply, fill #0

## 2023-07-10 MED ORDER — DOXYCYCLINE HYCLATE 100 MG PO CAPS
100.0000 mg | ORAL_CAPSULE | Freq: Two times a day (BID) | ORAL | 0 refills | Status: DC
  Filled 2023-07-10: qty 14, 7d supply, fill #0

## 2023-07-11 ENCOUNTER — Other Ambulatory Visit (HOSPITAL_COMMUNITY): Payer: Self-pay

## 2023-07-11 ENCOUNTER — Other Ambulatory Visit: Payer: Self-pay

## 2023-07-11 ENCOUNTER — Other Ambulatory Visit: Payer: Self-pay | Admitting: Pharmacist

## 2023-07-11 ENCOUNTER — Ambulatory Visit: Payer: Commercial Managed Care - PPO | Attending: Family Medicine | Admitting: Pharmacist

## 2023-07-11 DIAGNOSIS — Z79899 Other long term (current) drug therapy: Secondary | ICD-10-CM

## 2023-07-11 MED ORDER — TEZSPIRE 210 MG/1.91ML ~~LOC~~ SOAJ
SUBCUTANEOUS | 6 refills | Status: DC
  Filled 2023-07-11: qty 1.91, fill #0
  Filled 2023-07-12: qty 1.91, 28d supply, fill #0
  Filled 2023-08-07 – 2023-08-25 (×2): qty 1.91, 28d supply, fill #1
  Filled 2023-09-13: qty 1.91, 28d supply, fill #2
  Filled 2023-12-28: qty 1, 28d supply, fill #3

## 2023-07-11 MED ORDER — TEZSPIRE 210 MG/1.91ML ~~LOC~~ SOAJ: SUBCUTANEOUS | 6 refills | Status: DC

## 2023-07-11 NOTE — Progress Notes (Signed)
Pharmacy Patient Advocate Encounter  Insurance verification completed.   The patient is insured through [facility]   Ran test claim for Tezspire. Currently a quantity of 1.91 ml pen is a 28 day supply and the co-pay is $0. Patient has Tezspire copay card.  This test claim was processed through [facility]- copay amounts may vary at other pharmacies due to pharmacy/plan contracts, or as the patient moves through the different stages of their insurance plan.

## 2023-07-11 NOTE — Progress Notes (Signed)
Please see OV from today for complete documentation.   Butch Penny, PharmD, Patsy [place], CPP Clinical Pharmacist [facility] & [facility] [phone]

## 2023-07-11 NOTE — Progress Notes (Signed)
   HPI Patient presents today to [facility] clinic to see pharmacy team for Tezspire new start. She is taking this for asthma and is managed by Dr. [place] Callas.  Adherence: has not yet started   Efficacy: has not yet started   OBJECTIVE Allergies  Allergen Reactions   Latex Rash and Other (See Comments)    wheezing   Pork-Derived Products Hives, Diarrhea, Nausea And Vomiting and Palpitations     Immunization History  Administered Date(s) Administered   Influenza Split 03/13/2010   PFIZER(Purple Top)SARS-COV-2 Vaccination 02/05/2020, 05/28/2020   Td (Adult), 2 Lf Tetanus Toxid, Preservative Free 07/30/2005     PFTs     No data to display           Eosinophils and IgE level measured/monitored by her specialist.   Assessment   Biologics training for tezepulumab Dorothea Ogle)  Goals of therapy: Mechanism: human monoclonal IgG2? antibody that binds to TSLP. This blocks TSLP from its effect on inflammation including reduce eosinophils, IgE, FeNO, IL-5, and IL-13. Mechanism is not definitively established. Reviewed that Dorothea Ogle is add-on medication and patient must continue maintenance inhaler regimen. Response to therapy: may take 3-4 months to determine efficacy.  Side effects: injection site reaction (6-18%), antibody development (2%), arthralgia (4%), back pain (4%), pharyngitis (4%)  Dose: Tezspire 210 mg once every 4 weeks  Administration/Storage:  Reviewed administration sites of thigh or abdomen (at least 2-3 inches away from abdomen). Reviewed the upper arm is only appropriate if caregiver is administering injection  Do not shake pen/syringe as this could lead to product foaming or precipitation. Do not shake syringe as this could lead to product foaming or precipitation.  Access: Approval of Tezspire through: insurance Patient enrolled into copay card program to help with copay assistance.   Medication Reconciliation  A drug regimen assessment was performed, including  review of allergies, interactions, disease-state management, dosing and immunization history. Medications were reviewed with the patient, including name, instructions, indication, goals of therapy, potential side effects, importance of adherence, and safe use.  Drug interaction(s): none identified  Immunizations  Patient is indicated for the influenzae, pneumonia, and shingles vaccinations. Patient has received COVID19 vaccines.   PLAN Start Tezspire 210mg  SQ every 28 days.  Rx sent to: [facility]: [phone] .    All questions encouraged and answered.  Instructed patient to reach out with any further questions or concerns.  Thank you for allowing pharmacy to participate in this patient's care.  This appointment required of patient care (this includes precharting, chart review, review of results, face-to-face care, etc.).   Butch Penny, PharmD, Patsy [place], CPP Clinical Pharmacist [facility] & [facility] [phone]

## 2023-07-12 ENCOUNTER — Other Ambulatory Visit: Payer: Self-pay

## 2023-07-12 NOTE — Progress Notes (Signed)
Specialty Pharmacy Initial Fill Coordination Note  [name] is a 48 y.o. female contacted today regarding initial fill of specialty medication(s) [name])   Patient requested [name] at [facility] at [place] date: 07/13/23   Medication will be filled on 1/29.   Patient is aware of $0 copayment.

## 2023-07-13 ENCOUNTER — Other Ambulatory Visit (HOSPITAL_COMMUNITY): Payer: Self-pay

## 2023-07-19 ENCOUNTER — Other Ambulatory Visit (HOSPITAL_COMMUNITY): Payer: Self-pay

## 2023-07-21 ENCOUNTER — Other Ambulatory Visit (HOSPITAL_COMMUNITY): Payer: Self-pay

## 2023-07-24 ENCOUNTER — Other Ambulatory Visit (HOSPITAL_COMMUNITY): Payer: Self-pay

## 2023-07-24 MED ORDER — FLUCONAZOLE 150 MG PO TABS
150.0000 mg | ORAL_TABLET | ORAL | 1 refills | Status: DC
  Filled 2023-07-24: qty 2, 3d supply, fill #0
  Filled 2023-11-28 – 2024-01-24 (×3): qty 2, 3d supply, fill #1

## 2023-08-02 ENCOUNTER — Other Ambulatory Visit: Payer: Self-pay

## 2023-08-03 ENCOUNTER — Other Ambulatory Visit (HOSPITAL_COMMUNITY): Payer: Self-pay

## 2023-08-03 DIAGNOSIS — T3695XA Adverse effect of unspecified systemic antibiotic, initial encounter: Secondary | ICD-10-CM | POA: Diagnosis not present

## 2023-08-03 DIAGNOSIS — N200 Calculus of kidney: Secondary | ICD-10-CM | POA: Diagnosis not present

## 2023-08-03 DIAGNOSIS — N3010 Interstitial cystitis (chronic) without hematuria: Secondary | ICD-10-CM | POA: Diagnosis not present

## 2023-08-03 DIAGNOSIS — B379 Candidiasis, unspecified: Secondary | ICD-10-CM | POA: Diagnosis not present

## 2023-08-03 MED ORDER — HYDROCODONE-ACETAMINOPHEN 7.5-325 MG PO TABS
1.0000 | ORAL_TABLET | Freq: Four times a day (QID) | ORAL | 0 refills | Status: AC | PRN
  Filled 2023-08-11: qty 120, 30d supply, fill #0

## 2023-08-03 MED ORDER — HYDROCODONE-ACETAMINOPHEN 7.5-325 MG PO TABS
1.0000 | ORAL_TABLET | Freq: Four times a day (QID) | ORAL | 0 refills | Status: AC | PRN
Start: 2023-08-03 — End: ?
  Filled 2023-09-08: qty 120, 30d supply, fill #0

## 2023-08-03 MED ORDER — HYDROCODONE-ACETAMINOPHEN 7.5-325 MG PO TABS
1.0000 | ORAL_TABLET | Freq: Four times a day (QID) | ORAL | 0 refills | Status: DC | PRN
  Filled 2023-10-09: qty 120, 30d supply, fill #0

## 2023-08-03 MED ORDER — FLUCONAZOLE 150 MG PO TABS
150.0000 mg | ORAL_TABLET | Freq: Every day | ORAL | 1 refills | Status: AC
  Filled 2023-08-03: qty 2, 2d supply, fill #0
  Filled 2023-08-24: qty 2, 2d supply, fill #1

## 2023-08-07 ENCOUNTER — Other Ambulatory Visit: Payer: Self-pay

## 2023-08-07 DIAGNOSIS — N200 Calculus of kidney: Secondary | ICD-10-CM | POA: Diagnosis not present

## 2023-08-07 NOTE — Progress Notes (Signed)
 Specialty Pharmacy Refill Coordination Note  [name] is a 48 y.o. female contacted today regarding refills of specialty medication(s) [name])   Patient requested [name] at [facility] at [place] date: 08/16/23   Medication will be filled on 08/15/23.

## 2023-08-10 ENCOUNTER — Other Ambulatory Visit (HOSPITAL_COMMUNITY): Payer: Self-pay

## 2023-08-11 ENCOUNTER — Other Ambulatory Visit (HOSPITAL_COMMUNITY): Payer: Self-pay

## 2023-08-11 ENCOUNTER — Other Ambulatory Visit: Payer: Self-pay

## 2023-08-15 ENCOUNTER — Other Ambulatory Visit: Payer: Self-pay

## 2023-08-17 ENCOUNTER — Other Ambulatory Visit (HOSPITAL_COMMUNITY): Payer: Self-pay

## 2023-08-24 ENCOUNTER — Other Ambulatory Visit: Payer: Self-pay

## 2023-08-24 ENCOUNTER — Other Ambulatory Visit (HOSPITAL_COMMUNITY): Payer: Self-pay

## 2023-08-25 ENCOUNTER — Other Ambulatory Visit (HOSPITAL_COMMUNITY): Payer: Self-pay

## 2023-09-01 ENCOUNTER — Other Ambulatory Visit (HOSPITAL_COMMUNITY): Payer: Self-pay

## 2023-09-01 MED ORDER — AZELASTINE HCL 137 MCG/SPRAY NA SOLN
1.0000 | Freq: Two times a day (BID) | NASAL | 6 refills | Status: AC
  Filled 2023-09-01: qty 30, 30d supply, fill #0
  Filled 2024-05-16: qty 30, 30d supply, fill #1

## 2023-09-01 MED ORDER — FLUTICASONE PROPIONATE 50 MCG/ACT NA SUSP
1.0000 | Freq: Every day | NASAL | 6 refills | Status: AC
  Filled 2023-09-01: qty 16, 30d supply, fill #0
  Filled 2024-05-16: qty 16, 30d supply, fill #1

## 2023-09-05 ENCOUNTER — Other Ambulatory Visit (HOSPITAL_COMMUNITY): Payer: Self-pay

## 2023-09-06 ENCOUNTER — Other Ambulatory Visit (HOSPITAL_COMMUNITY): Payer: Self-pay

## 2023-09-08 ENCOUNTER — Other Ambulatory Visit (HOSPITAL_COMMUNITY): Payer: Self-pay

## 2023-09-13 ENCOUNTER — Other Ambulatory Visit: Payer: Self-pay

## 2023-09-13 NOTE — Progress Notes (Signed)
 Specialty Pharmacy Refill Coordination Note  [name] is a 48 y.o. female contacted today regarding refills of specialty medication(s) [name])   Patient requested (Patient-Rptd) Delivery   Delivery date: (Patient-Rptd) 09/21/23   Verified address: (Patient-Rptd) [address] [place] , [place] [address]   Medication will be filled on 09/20/23.

## 2023-10-02 ENCOUNTER — Other Ambulatory Visit (HOSPITAL_COMMUNITY): Payer: Self-pay

## 2023-10-02 DIAGNOSIS — J301 Allergic rhinitis due to pollen: Secondary | ICD-10-CM | POA: Diagnosis not present

## 2023-10-02 DIAGNOSIS — J4540 Moderate persistent asthma, uncomplicated: Secondary | ICD-10-CM | POA: Diagnosis not present

## 2023-10-02 DIAGNOSIS — J3089 Other allergic rhinitis: Secondary | ICD-10-CM | POA: Diagnosis not present

## 2023-10-02 DIAGNOSIS — H1045 Other chronic allergic conjunctivitis: Secondary | ICD-10-CM | POA: Diagnosis not present

## 2023-10-02 MED ORDER — LIDOCAINE VISCOUS HCL 2 % MT SOLN
10.0000 mL | Freq: Three times a day (TID) | OROMUCOSAL | 0 refills | Status: AC
  Filled 2023-10-02: qty 240, 8d supply, fill #0

## 2023-10-05 ENCOUNTER — Other Ambulatory Visit (HOSPITAL_COMMUNITY): Payer: Self-pay

## 2023-10-06 ENCOUNTER — Other Ambulatory Visit (HOSPITAL_COMMUNITY): Payer: Self-pay

## 2023-10-09 ENCOUNTER — Other Ambulatory Visit (HOSPITAL_COMMUNITY): Payer: Self-pay

## 2023-10-11 ENCOUNTER — Other Ambulatory Visit (HOSPITAL_COMMUNITY): Payer: Self-pay

## 2023-10-13 ENCOUNTER — Other Ambulatory Visit (HOSPITAL_COMMUNITY): Payer: Self-pay

## 2023-10-19 ENCOUNTER — Other Ambulatory Visit: Payer: Self-pay

## 2023-10-19 ENCOUNTER — Other Ambulatory Visit (HOSPITAL_COMMUNITY): Payer: Self-pay

## 2023-10-19 DIAGNOSIS — K1329 Other disturbances of oral epithelium, including tongue: Secondary | ICD-10-CM | POA: Diagnosis not present

## 2023-10-19 MED ORDER — HYDROCODONE-ACETAMINOPHEN 10-325 MG PO TABS
1.0000 | ORAL_TABLET | Freq: Four times a day (QID) | ORAL | 0 refills | Status: DC | PRN
  Filled 2023-10-19 – 2023-11-07 (×2): qty 12, 3d supply, fill #0

## 2023-10-20 ENCOUNTER — Other Ambulatory Visit (HOSPITAL_COMMUNITY): Payer: Self-pay

## 2023-10-25 ENCOUNTER — Other Ambulatory Visit (HOSPITAL_COMMUNITY): Payer: Self-pay

## 2023-10-25 DIAGNOSIS — N3010 Interstitial cystitis (chronic) without hematuria: Secondary | ICD-10-CM | POA: Diagnosis not present

## 2023-10-25 DIAGNOSIS — B379 Candidiasis, unspecified: Secondary | ICD-10-CM | POA: Diagnosis not present

## 2023-10-25 DIAGNOSIS — R102 Pelvic and perineal pain: Secondary | ICD-10-CM | POA: Diagnosis not present

## 2023-10-25 DIAGNOSIS — M6289 Other specified disorders of muscle: Secondary | ICD-10-CM | POA: Diagnosis not present

## 2023-10-25 DIAGNOSIS — T3695XA Adverse effect of unspecified systemic antibiotic, initial encounter: Secondary | ICD-10-CM | POA: Diagnosis not present

## 2023-10-25 MED ORDER — DIAZEPAM 10 MG PO TABS
10.0000 mg | ORAL_TABLET | Freq: Every evening | ORAL | 5 refills | Status: AC
  Filled 2023-10-25 – 2023-12-26 (×3): qty 30, 30d supply, fill #0

## 2023-10-25 MED ORDER — FLUCONAZOLE 150 MG PO TABS
150.0000 mg | ORAL_TABLET | Freq: Once | ORAL | 5 refills | Status: AC
  Filled 2023-10-25: qty 2, 4d supply, fill #0
  Filled 2023-12-11: qty 2, 2d supply, fill #0

## 2023-11-03 ENCOUNTER — Other Ambulatory Visit (HOSPITAL_COMMUNITY): Payer: Self-pay

## 2023-11-07 ENCOUNTER — Other Ambulatory Visit (HOSPITAL_COMMUNITY): Payer: Self-pay

## 2023-11-09 ENCOUNTER — Other Ambulatory Visit (HOSPITAL_COMMUNITY): Payer: Self-pay

## 2023-11-09 MED ORDER — HYDROCODONE-ACETAMINOPHEN 7.5-325 MG PO TABS
1.0000 | ORAL_TABLET | Freq: Four times a day (QID) | ORAL | 0 refills | Status: DC | PRN
  Filled 2023-11-09: qty 120, 30d supply, fill #0

## 2023-11-28 ENCOUNTER — Telehealth: Admitting: Physician Assistant

## 2023-11-28 ENCOUNTER — Other Ambulatory Visit (HOSPITAL_COMMUNITY): Payer: Self-pay

## 2023-11-28 ENCOUNTER — Other Ambulatory Visit: Payer: Self-pay

## 2023-11-28 DIAGNOSIS — J4531 Mild persistent asthma with (acute) exacerbation: Secondary | ICD-10-CM

## 2023-11-28 DIAGNOSIS — L700 Acne vulgaris: Secondary | ICD-10-CM | POA: Diagnosis not present

## 2023-11-28 MED ORDER — TRETINOIN 0.05 % EX CREA
TOPICAL_CREAM | Freq: Every day | CUTANEOUS | 5 refills | Status: AC
  Filled 2023-11-28: qty 20, 30d supply, fill #0
  Filled 2024-06-27: qty 20, 30d supply, fill #1

## 2023-11-28 MED ORDER — BENZONATATE 100 MG PO CAPS
100.0000 mg | ORAL_CAPSULE | Freq: Three times a day (TID) | ORAL | 0 refills | Status: DC | PRN
  Filled 2023-11-28: qty 30, 5d supply, fill #0

## 2023-11-28 MED ORDER — SPIRONOLACTONE 50 MG PO TABS
ORAL_TABLET | ORAL | 2 refills | Status: AC
  Filled 2023-11-28: qty 60, 37d supply, fill #0

## 2023-11-28 MED ORDER — PREDNISONE 10 MG (21) PO TBPK
ORAL_TABLET | ORAL | 0 refills | Status: DC
  Filled 2023-11-28: qty 21, 6d supply, fill #0

## 2023-11-28 NOTE — Progress Notes (Signed)
 E-Visit for Cough  We are sorry that you are not feeling well.  Here is how we plan to help!  Based on your presentation I believe you most likely have A cough due to a virus.  This is called viral bronchitis and is best treated by rest, plenty of fluids and control of the cough.  You may use Ibuprofen or Tylenol as directed to help your symptoms.     In addition you may use A non-prescription cough medication called Mucinex DM: take 2 tablets every 12 hours. and A prescription cough medication called Tessalon Perles 100mg . You may take 1-2 capsules every 8 hours as needed for your cough.  Prednisone 10 mg daily for 6 days (see taper instructions below)  Directions for 6 day taper: Day 1: 2 tablets before breakfast, 1 after both lunch & dinner and 2 at bedtime Day 2: 1 tab before breakfast, 1 after both lunch & dinner and 2 at bedtime Day 3: 1 tab at each meal & 1 at bedtime Day 4: 1 tab at breakfast, 1 at lunch, 1 at bedtime Day 5: 1 tab at breakfast & 1 tab at bedtime Day 6: 1 tab at breakfast  From your responses in the eVisit questionnaire you describe inflammation in the upper respiratory tract which is causing a significant cough.  This is commonly called Bronchitis and has four common causes:   Allergies Viral Infections Acid Reflux Bacterial Infection Allergies, viruses and acid reflux are treated by controlling symptoms or eliminating the cause. An example might be a cough caused by taking certain blood pressure medications. You stop the cough by changing the medication. Another example might be a cough caused by acid reflux. Controlling the reflux helps control the cough.  USE OF BRONCHODILATOR ("RESCUE") INHALERS: There is a risk from using your bronchodilator too frequently.  The risk is that over-reliance on a medication which only relaxes the muscles surrounding the breathing tubes can reduce the effectiveness of medications prescribed to reduce swelling and congestion of the  tubes themselves.  Although you feel brief relief from the bronchodilator inhaler, your asthma may actually be worsening with the tubes becoming more swollen and filled with mucus.  This can delay other crucial treatments, such as oral steroid medications. If you need to use a bronchodilator inhaler daily, several times per day, you should discuss this with your provider.  There are probably better treatments that could be used to keep your asthma under control.     HOME CARE Only take medications as instructed by your medical team. Complete the entire course of an antibiotic. Drink plenty of fluids and get plenty of rest. Avoid close contacts especially the very young and the elderly Cover your mouth if you cough or cough into your sleeve. Always remember to wash your hands A steam or ultrasonic humidifier can help congestion.   GET HELP RIGHT AWAY IF: You develop worsening fever. You become short of breath You cough up blood. Your symptoms persist after you have completed your treatment plan MAKE SURE YOU  Understand these instructions. Will watch your condition. Will get help right away if you are not doing well or get worse.    Thank you for choosing an e-visit.  Your e-visit answers were reviewed by a board certified advanced clinical practitioner to complete your personal care plan. Depending upon the condition, your plan could have included both over the counter or prescription medications.  Please review your pharmacy choice. Make sure the pharmacy is  open so you can pick up prescription now. If there is a problem, you may contact your provider through [employer] and have the prescription routed to another pharmacy.  Your safety is important to [place]. If you have drug allergies check your prescription carefully.   For the next 24 hours you can use MyChart to ask questions about today's visit, request a non-urgent call back, or ask for a work or school excuse. You will get an  email in the next two days asking about your experience. I hope that your e-visit has been valuable and will speed your recovery.  I have spent 5 minutes in review of e-visit questionnaire, review and updating patient chart, medical decision making and response to patient.   Margaretann Loveless, PA-C

## 2023-11-30 DIAGNOSIS — Z1231 Encounter for screening mammogram for malignant neoplasm of breast: Secondary | ICD-10-CM | POA: Diagnosis not present

## 2023-12-07 ENCOUNTER — Other Ambulatory Visit (HOSPITAL_COMMUNITY): Payer: Self-pay

## 2023-12-08 ENCOUNTER — Other Ambulatory Visit (HOSPITAL_COMMUNITY): Payer: Self-pay

## 2023-12-11 ENCOUNTER — Other Ambulatory Visit (HOSPITAL_COMMUNITY): Payer: Self-pay

## 2023-12-11 MED ORDER — HYDROCODONE-ACETAMINOPHEN 7.5-325 MG PO TABS
1.0000 | ORAL_TABLET | Freq: Four times a day (QID) | ORAL | 0 refills | Status: DC | PRN
  Filled 2023-12-11: qty 120, 30d supply, fill #0

## 2023-12-16 ENCOUNTER — Other Ambulatory Visit (HOSPITAL_COMMUNITY): Payer: Self-pay

## 2023-12-18 ENCOUNTER — Other Ambulatory Visit (HOSPITAL_COMMUNITY): Payer: Self-pay

## 2023-12-18 ENCOUNTER — Other Ambulatory Visit: Payer: Self-pay

## 2023-12-18 MED ORDER — MONTELUKAST SODIUM 10 MG PO TABS
10.0000 mg | ORAL_TABLET | Freq: Every evening | ORAL | 3 refills | Status: AC
  Filled 2023-12-18: qty 90, 90d supply, fill #0
  Filled 2024-06-27 – 2024-07-14 (×2): qty 90, 90d supply, fill #1

## 2023-12-22 ENCOUNTER — Other Ambulatory Visit: Payer: Self-pay

## 2023-12-27 ENCOUNTER — Other Ambulatory Visit (HOSPITAL_COMMUNITY): Payer: Self-pay

## 2023-12-28 ENCOUNTER — Other Ambulatory Visit: Payer: Self-pay

## 2023-12-28 ENCOUNTER — Other Ambulatory Visit: Payer: Self-pay | Admitting: Pharmacist

## 2023-12-28 MED ORDER — TEZSPIRE 210 MG/1.91ML ~~LOC~~ SOAJ
SUBCUTANEOUS | 0 refills | Status: DC
  Filled 2023-12-28: qty 1.91, 28d supply, fill #0

## 2023-12-28 NOTE — Progress Notes (Signed)
 Specialty Pharmacy Refill Coordination Note  [name] is a 48 y.o. female contacted today regarding refills of specialty medication(s) Tezepelumab -ekko (Tezspire )   Patient requested Delivery   Delivery date: 01/02/24   Verified address: [address]   Medication will be filled on 7/21.

## 2024-01-01 ENCOUNTER — Other Ambulatory Visit: Payer: Self-pay

## 2024-01-11 ENCOUNTER — Other Ambulatory Visit (HOSPITAL_COMMUNITY): Payer: Self-pay

## 2024-01-11 DIAGNOSIS — M6289 Other specified disorders of muscle: Secondary | ICD-10-CM | POA: Diagnosis not present

## 2024-01-11 DIAGNOSIS — R102 Pelvic and perineal pain: Secondary | ICD-10-CM | POA: Diagnosis not present

## 2024-01-11 DIAGNOSIS — N3010 Interstitial cystitis (chronic) without hematuria: Secondary | ICD-10-CM | POA: Diagnosis not present

## 2024-01-11 MED ORDER — HYDROCODONE-ACETAMINOPHEN 7.5-325 MG PO TABS
1.0000 | ORAL_TABLET | Freq: Four times a day (QID) | ORAL | 0 refills | Status: AC | PRN
  Filled 2024-01-11 (×2): qty 120, 30d supply, fill #0

## 2024-01-11 MED ORDER — HYDROCODONE-ACETAMINOPHEN 7.5-325 MG PO TABS
1.0000 | ORAL_TABLET | Freq: Four times a day (QID) | ORAL | 0 refills | Status: DC | PRN
  Filled 2024-01-11 – 2024-03-08 (×2): qty 120, 30d supply, fill #0

## 2024-01-11 MED ORDER — OXYBUTYNIN CHLORIDE ER 10 MG PO TB24
10.0000 mg | ORAL_TABLET | Freq: Every day | ORAL | 3 refills | Status: DC
  Filled 2024-01-11: qty 90, 90d supply, fill #0

## 2024-01-11 MED ORDER — DIAZEPAM 10 MG PO TABS
ORAL_TABLET | ORAL | 5 refills | Status: DC
  Filled 2024-01-11 – 2024-06-27 (×2): qty 30, 30d supply, fill #0

## 2024-01-11 MED ORDER — MONTELUKAST SODIUM 10 MG PO TABS
10.0000 mg | ORAL_TABLET | Freq: Every evening | ORAL | 3 refills | Status: AC
  Filled 2024-05-03: qty 90, 90d supply, fill #0
  Filled 2024-05-16: qty 90, 90d supply, fill #1

## 2024-01-11 MED ORDER — HYDROCODONE-ACETAMINOPHEN 7.5-325 MG PO TABS
1.0000 | ORAL_TABLET | Freq: Four times a day (QID) | ORAL | 0 refills | Status: AC | PRN
  Filled 2024-01-11 – 2024-02-09 (×2): qty 120, 30d supply, fill #0

## 2024-01-12 ENCOUNTER — Other Ambulatory Visit: Payer: Self-pay

## 2024-01-17 ENCOUNTER — Other Ambulatory Visit (HOSPITAL_COMMUNITY): Payer: Self-pay

## 2024-01-17 ENCOUNTER — Other Ambulatory Visit: Payer: Self-pay

## 2024-01-17 ENCOUNTER — Other Ambulatory Visit: Payer: Self-pay | Admitting: Pharmacist

## 2024-01-17 MED ORDER — TEZSPIRE 210 MG/1.91ML ~~LOC~~ SOAJ
SUBCUTANEOUS | 3 refills | Status: AC
Start: 2024-01-17 — End: ?
  Filled 2024-01-17: qty 1.91, fill #0
  Filled 2024-01-22: qty 1.91, 28d supply, fill #0

## 2024-01-17 MED ORDER — TEZSPIRE 210 MG/1.91ML ~~LOC~~ SOAJ
SUBCUTANEOUS | 3 refills | Status: DC
  Filled 2024-01-17: qty 1.91, 28d supply, fill #0

## 2024-01-22 ENCOUNTER — Other Ambulatory Visit: Payer: Self-pay

## 2024-01-24 ENCOUNTER — Other Ambulatory Visit (HOSPITAL_COMMUNITY): Payer: Self-pay

## 2024-01-24 ENCOUNTER — Other Ambulatory Visit: Payer: Self-pay

## 2024-01-25 ENCOUNTER — Other Ambulatory Visit: Payer: Self-pay

## 2024-02-08 ENCOUNTER — Other Ambulatory Visit (HOSPITAL_COMMUNITY): Payer: Self-pay

## 2024-02-08 ENCOUNTER — Other Ambulatory Visit: Payer: Self-pay

## 2024-02-09 ENCOUNTER — Other Ambulatory Visit (HOSPITAL_COMMUNITY): Payer: Self-pay

## 2024-02-19 ENCOUNTER — Other Ambulatory Visit (HOSPITAL_COMMUNITY): Payer: Self-pay

## 2024-02-19 ENCOUNTER — Telehealth: Admitting: Physician Assistant

## 2024-02-19 DIAGNOSIS — R3989 Other symptoms and signs involving the genitourinary system: Secondary | ICD-10-CM | POA: Diagnosis not present

## 2024-02-19 MED ORDER — CEPHALEXIN 500 MG PO CAPS
500.0000 mg | ORAL_CAPSULE | Freq: Two times a day (BID) | ORAL | 0 refills | Status: AC
  Filled 2024-02-19: qty 14, 7d supply, fill #0

## 2024-02-19 NOTE — Progress Notes (Signed)
 E-Visit for Urinary Problems  We are sorry that you are not feeling well.  Here is how we plan to help!  Based on what you shared with me it looks like you most likely have a simple urinary tract infection.  A UTI (Urinary Tract Infection) is a bacterial infection of the bladder.  Most cases of urinary tract infections are simple to treat but a key part of your care is to encourage you to drink plenty of fluids and watch your symptoms carefully.  I have prescribed Keflex  500 mg twice a day for 7 days.  Your symptoms should gradually improve. Call us  if the burning in your urine worsens, you develop worsening fever, back pain or pelvic pain or if your symptoms do not resolve after completing the antibiotic.  Urinary tract infections can be prevented by drinking plenty of water to keep your body hydrated.  Also be sure when you wipe, wipe from front to back and don't hold it in!  If possible, empty your bladder every 4 hours.  HOME CARE Drink plenty of fluids Compete the full course of the antibiotics even if the symptoms resolve Remember, when you need to go.go. Holding in your urine can increase the likelihood of getting a UTI! GET HELP RIGHT AWAY IF: You cannot urinate You get a high fever Worsening back pain occurs You see blood in your urine You feel sick to your stomach or throw up You feel like you are going to pass out  MAKE SURE YOU  Understand these instructions. Will watch your condition. Will get help right away if you are not doing well or get worse.   Thank you for choosing an e-visit.  Your e-visit answers were reviewed by a board certified advanced clinical practitioner to complete your personal care plan. Depending upon the condition, your plan could have included both over the counter or prescription medications.  Please review your pharmacy choice. Make sure the pharmacy is open so you can pick up prescription now. If there is a problem, you may contact your  provider through [employer] and have the prescription routed to another pharmacy.  Your safety is important to us . If you have drug allergies check your prescription carefully.   For the next 24 hours you can use MyChart to ask questions about today's visit, request a non-urgent call back, or ask for a work or school excuse. You will get an email in the next two days asking about your experience. I hope that your e-visit has been valuable and will speed your recovery.  I have spent 5 minutes in review of e-visit questionnaire, review and updating patient chart, medical decision making and response to patient.   Delon CHRISTELLA Dickinson, PA-C

## 2024-02-23 ENCOUNTER — Other Ambulatory Visit (HOSPITAL_COMMUNITY): Payer: Self-pay

## 2024-02-26 ENCOUNTER — Other Ambulatory Visit (HOSPITAL_COMMUNITY): Payer: Self-pay

## 2024-02-26 MED ORDER — FLUCONAZOLE 150 MG PO TABS
150.0000 mg | ORAL_TABLET | Freq: Every day | ORAL | 1 refills | Status: DC
  Filled 2024-02-26: qty 2, 3d supply, fill #0
  Filled 2024-05-02: qty 2, 3d supply, fill #1

## 2024-02-26 MED ORDER — ONDANSETRON 8 MG PO TBDP
8.0000 mg | ORAL_TABLET | Freq: Three times a day (TID) | ORAL | 12 refills | Status: DC | PRN
  Filled 2024-02-26: qty 30, 10d supply, fill #0
  Filled 2024-04-03: qty 30, 10d supply, fill #1
  Filled 2024-05-02: qty 30, 10d supply, fill #2
  Filled 2024-05-16: qty 30, 10d supply, fill #3
  Filled 2024-06-27: qty 30, 10d supply, fill #4

## 2024-03-05 ENCOUNTER — Other Ambulatory Visit (HOSPITAL_COMMUNITY): Payer: Self-pay

## 2024-03-05 ENCOUNTER — Telehealth: Admitting: Physician Assistant

## 2024-03-05 DIAGNOSIS — H1032 Unspecified acute conjunctivitis, left eye: Secondary | ICD-10-CM

## 2024-03-05 MED ORDER — POLYMYXIN B-TRIMETHOPRIM 10000-0.1 UNIT/ML-% OP SOLN
OPHTHALMIC | 0 refills | Status: AC
  Filled 2024-03-05: qty 10, 25d supply, fill #0

## 2024-03-05 NOTE — Progress Notes (Signed)

## 2024-03-05 NOTE — Progress Notes (Signed)
 I have spent 5 minutes in review of e-visit questionnaire, review and updating patient chart, medical decision making and response to patient.   Elsie Velma Lunger, PA-C

## 2024-03-08 ENCOUNTER — Other Ambulatory Visit (HOSPITAL_COMMUNITY): Payer: Self-pay

## 2024-03-15 ENCOUNTER — Other Ambulatory Visit (HOSPITAL_COMMUNITY): Payer: Self-pay

## 2024-03-19 ENCOUNTER — Telehealth (HOSPITAL_COMMUNITY): Payer: Self-pay

## 2024-03-19 ENCOUNTER — Other Ambulatory Visit (HOSPITAL_COMMUNITY): Payer: Self-pay

## 2024-03-19 DIAGNOSIS — J4521 Mild intermittent asthma with (acute) exacerbation: Secondary | ICD-10-CM | POA: Diagnosis not present

## 2024-03-19 DIAGNOSIS — Z0000 Encounter for general adult medical examination without abnormal findings: Secondary | ICD-10-CM | POA: Diagnosis not present

## 2024-03-19 DIAGNOSIS — N200 Calculus of kidney: Secondary | ICD-10-CM | POA: Diagnosis not present

## 2024-03-19 DIAGNOSIS — K219 Gastro-esophageal reflux disease without esophagitis: Secondary | ICD-10-CM | POA: Diagnosis not present

## 2024-03-19 DIAGNOSIS — E039 Hypothyroidism, unspecified: Secondary | ICD-10-CM | POA: Diagnosis not present

## 2024-03-19 DIAGNOSIS — M2550 Pain in unspecified joint: Secondary | ICD-10-CM | POA: Diagnosis not present

## 2024-03-19 DIAGNOSIS — E7800 Pure hypercholesterolemia, unspecified: Secondary | ICD-10-CM | POA: Diagnosis not present

## 2024-03-19 DIAGNOSIS — N3010 Interstitial cystitis (chronic) without hematuria: Secondary | ICD-10-CM | POA: Diagnosis not present

## 2024-03-19 DIAGNOSIS — G43909 Migraine, unspecified, not intractable, without status migrainosus: Secondary | ICD-10-CM | POA: Diagnosis not present

## 2024-03-19 DIAGNOSIS — F411 Generalized anxiety disorder: Secondary | ICD-10-CM | POA: Diagnosis not present

## 2024-03-19 MED ORDER — ALBUTEROL SULFATE HFA 108 (90 BASE) MCG/ACT IN AERS
2.0000 | INHALATION_SPRAY | Freq: Four times a day (QID) | RESPIRATORY_TRACT | 6 refills | Status: AC
  Filled 2024-03-19: qty 6.7, 20d supply, fill #0
  Filled 2024-05-02: qty 6.7, 20d supply, fill #1
  Filled 2024-05-16 – 2024-06-03 (×2): qty 6.7, 20d supply, fill #2
  Filled 2024-06-27: qty 6.7, 20d supply, fill #3

## 2024-03-19 MED ORDER — NURTEC 75 MG PO TBDP
75.0000 mg | ORAL_TABLET | Freq: Every day | ORAL | 5 refills | Status: AC
  Filled 2024-03-19 – 2024-03-20 (×2): qty 8, 8d supply, fill #0
  Filled 2024-03-21: qty 8, 30d supply, fill #0
  Filled 2024-05-02: qty 8, 30d supply, fill #1
  Filled 2024-05-16 – 2024-06-03 (×2): qty 8, 30d supply, fill #2
  Filled 2024-06-27: qty 8, 30d supply, fill #3

## 2024-03-20 ENCOUNTER — Other Ambulatory Visit (HOSPITAL_COMMUNITY): Payer: Self-pay

## 2024-03-21 ENCOUNTER — Other Ambulatory Visit: Payer: Self-pay

## 2024-03-21 ENCOUNTER — Other Ambulatory Visit (HOSPITAL_COMMUNITY): Payer: Self-pay

## 2024-04-01 DIAGNOSIS — J4540 Moderate persistent asthma, uncomplicated: Secondary | ICD-10-CM | POA: Diagnosis not present

## 2024-04-01 DIAGNOSIS — J3089 Other allergic rhinitis: Secondary | ICD-10-CM | POA: Diagnosis not present

## 2024-04-01 DIAGNOSIS — J301 Allergic rhinitis due to pollen: Secondary | ICD-10-CM | POA: Diagnosis not present

## 2024-04-01 DIAGNOSIS — H1045 Other chronic allergic conjunctivitis: Secondary | ICD-10-CM | POA: Diagnosis not present

## 2024-04-05 ENCOUNTER — Telehealth: Admitting: Emergency Medicine

## 2024-04-05 ENCOUNTER — Other Ambulatory Visit (HOSPITAL_COMMUNITY): Payer: Self-pay

## 2024-04-05 DIAGNOSIS — J45901 Unspecified asthma with (acute) exacerbation: Secondary | ICD-10-CM | POA: Diagnosis not present

## 2024-04-05 MED ORDER — BENZONATATE 100 MG PO CAPS
100.0000 mg | ORAL_CAPSULE | Freq: Two times a day (BID) | ORAL | 0 refills | Status: AC | PRN
  Filled 2024-04-05: qty 20, 10d supply, fill #0

## 2024-04-05 MED ORDER — PREDNISONE 20 MG PO TABS
40.0000 mg | ORAL_TABLET | Freq: Every day | ORAL | 0 refills | Status: AC
  Filled 2024-04-05: qty 10, 5d supply, fill #0

## 2024-04-05 NOTE — Progress Notes (Signed)
 E Visit for Asthma  Based on what you have shared with me, it looks like you may have a flare up of your asthma.  Asthma is a chronic (ongoing) lung disease which results in airway obstruction, inflammation and hyper-responsiveness.   It does not sound like you have a bacterial infection. A bacterial infection is not likely as you have only been sick for 3 days.   Asthma symptoms vary from person to person, with common symptoms including nighttime awakening and decreased ability to participate in normal activities due to shortness of breath. It is often triggered by changes in weather, changes in the season, changes in air temperature, or inside (home, school, daycare or work) allergens such as animal dander, mold, mildew, woodstoves or cockroaches.   It can also be triggered by hormonal changes, extreme emotion, physical exertion or an upper respiratory tract illness.     It is important to identify the trigger and eliminate or avoid the trigger if possible.   If you have been prescribed medications to be taken on a regular basis, it is important to follow the asthma action plan and to follow guidelines to adjust medication in response to increasing symptoms of decreased peak expiratory flow rate.  Treatment: I have prescribed: Prednisone  40mg  by mouth per day for 5 days  I have also prescribed tessalon  perles for your cough as requested.   If this treatment plan does not help you get better, please get checked at an in person clinic.   HOME CARE Only take medications as instructed by your medical team. Consider wearing a mask or scarf to improve breathing when there is poor air quality as this has been shown to decrease irritation and decrease exacerbations Get rest. Using a humidifier may help nasal congestion and ease sore throat pain. Using a saline nasal spray works much the  same way.  Cough drops, hard candies and sore throat lozenges may ease your cough.  Avoid close contacts, especially the very you and the elderly. Cover your mouth if you cough or sneeze. Always remember to wash your hands.   GET HELP RIGHT AWAY IF: You develop worsening shortness of breath/difficulty breathing or chest tightness, breathlessness at rest, drowsy, confused or agitated, unable to speak in full sentences, or if you develop chest pain.  You have coughing fits. You develop a severe headache or visual changes. You develop shortness of breath, difficulty breathing or start having chest pain. Your symptoms persist after you have completed your treatment plan. If your symptoms do not improve within 5 days.  MAKE SURE YOU Understand these instructions. Will watch your condition. Will get help right away if you are not doing well or get worse.   Your e-visit answers were reviewed by a board certified advanced clinical practitioner to complete your personal care plan, Depending upon the condition, your plan could have included both over the counter or prescription medications.  Please review your pharmacy choice. Your safety is important to us . If you have drug allergies check your prescription carefully. You can use MyChart to ask questions about today's visit, request a non-urgent call back, or ask for a work or school excuse for 24 hours related to this e-Visit. If it has been greater than 24 hours you will need to follow up with your provider, or enter a new e-Visit to address those concerns.  You will get an e-mail in the next two days asking about your experience. I hope that your e-visit has been valuable and  will speed your recovery. Thank you for using e-visits.  I have spent 5 minutes in review of e-visit questionnaire, review and updating patient chart, medical decision making and response to patient.   Jon Belt, PhD, FNP-BC

## 2024-04-09 ENCOUNTER — Other Ambulatory Visit (HOSPITAL_COMMUNITY): Payer: Self-pay

## 2024-04-09 ENCOUNTER — Other Ambulatory Visit: Payer: Self-pay

## 2024-04-09 MED ORDER — HYDROCODONE-ACETAMINOPHEN 7.5-325 MG PO TABS
1.0000 | ORAL_TABLET | Freq: Four times a day (QID) | ORAL | 0 refills | Status: DC | PRN
  Filled 2024-04-09: qty 120, 30d supply, fill #0

## 2024-04-16 ENCOUNTER — Other Ambulatory Visit: Payer: Self-pay | Admitting: Medical Genetics

## 2024-04-16 DIAGNOSIS — Z006 Encounter for examination for normal comparison and control in clinical research program: Secondary | ICD-10-CM

## 2024-04-17 ENCOUNTER — Other Ambulatory Visit (HOSPITAL_COMMUNITY): Payer: Self-pay

## 2024-04-17 ENCOUNTER — Other Ambulatory Visit: Payer: Self-pay

## 2024-04-17 DIAGNOSIS — M6289 Other specified disorders of muscle: Secondary | ICD-10-CM | POA: Diagnosis not present

## 2024-04-17 DIAGNOSIS — R102 Pelvic and perineal pain unspecified side: Secondary | ICD-10-CM | POA: Diagnosis not present

## 2024-04-17 DIAGNOSIS — N3010 Interstitial cystitis (chronic) without hematuria: Secondary | ICD-10-CM | POA: Diagnosis not present

## 2024-04-17 DIAGNOSIS — N200 Calculus of kidney: Secondary | ICD-10-CM | POA: Diagnosis not present

## 2024-04-17 MED ORDER — SYRINGE (DISPOSABLE) 20 ML MISC
1.0000 | Freq: Every day | 11 refills | Status: AC
  Filled 2024-04-17: qty 30, 30d supply, fill #0

## 2024-04-17 MED ORDER — HYDROCODONE-ACETAMINOPHEN 7.5-325 MG PO TABS
1.0000 | ORAL_TABLET | Freq: Four times a day (QID) | ORAL | 0 refills | Status: AC | PRN
  Filled 2024-05-07: qty 120, 30d supply, fill #0

## 2024-04-17 MED ORDER — TAMSULOSIN HCL 0.4 MG PO CAPS
0.4000 mg | ORAL_CAPSULE | Freq: Every day | ORAL | 99 refills | Status: AC | PRN
Start: 2024-04-17 — End: ?
  Filled 2024-04-17: qty 30, 30d supply, fill #0

## 2024-04-17 MED ORDER — HYDROXYZINE HCL 25 MG PO TABS
100.0000 mg | ORAL_TABLET | Freq: Every evening | ORAL | 3 refills | Status: AC | PRN
Start: 2024-04-17 — End: ?
  Filled 2024-04-17: qty 360, 90d supply, fill #0

## 2024-04-17 MED ORDER — HYDROCODONE-ACETAMINOPHEN 7.5-325 MG PO TABS
1.0000 | ORAL_TABLET | Freq: Four times a day (QID) | ORAL | 0 refills | Status: AC | PRN
  Filled 2024-06-04: qty 120, 30d supply, fill #0

## 2024-04-17 MED ORDER — BUPIVACAINE HCL (PF) 0.5 % IJ SOLN
15.0000 mL | Freq: Every day | INTRAMUSCULAR | 99 refills | Status: AC
  Filled 2024-04-17: qty 420, 28d supply, fill #0
  Filled 2024-04-17 (×2): qty 30, 2d supply, fill #0
  Filled 2024-04-17: qty 420, 28d supply, fill #0

## 2024-04-17 MED ORDER — HYDROCHLOROTHIAZIDE 50 MG PO TABS
50.0000 mg | ORAL_TABLET | Freq: Every day | ORAL | 11 refills | Status: AC
  Filled 2024-04-17 – 2024-05-16 (×2): qty 30, 30d supply, fill #0
  Filled 2024-06-27: qty 30, 30d supply, fill #1

## 2024-04-17 MED ORDER — OXYBUTYNIN CHLORIDE ER 15 MG PO TB24
15.0000 mg | ORAL_TABLET | Freq: Every day | ORAL | 3 refills | Status: AC
  Filled 2024-04-17: qty 90, 90d supply, fill #0

## 2024-04-17 MED ORDER — POTASSIUM CITRATE ER 15 MEQ (1620 MG) PO TBCR
1.0000 | EXTENDED_RELEASE_TABLET | Freq: Two times a day (BID) | ORAL | 3 refills | Status: AC
  Filled 2024-04-17: qty 180, 90d supply, fill #0

## 2024-04-17 MED ORDER — NEEDLE (DISP) 18G X 1" MISC
1.0000 | Freq: Every day | 11 refills | Status: AC
  Filled 2024-04-17: qty 30, 30d supply, fill #0

## 2024-04-17 MED ORDER — ELMIRON 100 MG PO CAPS
200.0000 mg | ORAL_CAPSULE | Freq: Every day | ORAL | 11 refills | Status: AC
  Filled 2024-04-17: qty 60, 30d supply, fill #0

## 2024-04-17 MED ORDER — HYDROCODONE-ACETAMINOPHEN 7.5-325 MG PO TABS
1.0000 | ORAL_TABLET | Freq: Four times a day (QID) | ORAL | 0 refills | Status: DC | PRN
  Filled 2024-07-03: qty 120, 30d supply, fill #0

## 2024-04-18 ENCOUNTER — Other Ambulatory Visit: Payer: Self-pay

## 2024-04-24 ENCOUNTER — Other Ambulatory Visit (HOSPITAL_COMMUNITY): Payer: Self-pay

## 2024-04-24 ENCOUNTER — Other Ambulatory Visit: Payer: Self-pay

## 2024-04-24 DIAGNOSIS — M79672 Pain in left foot: Secondary | ICD-10-CM | POA: Diagnosis not present

## 2024-04-24 DIAGNOSIS — R7689 Other specified abnormal immunological findings in serum: Secondary | ICD-10-CM | POA: Diagnosis not present

## 2024-04-24 DIAGNOSIS — Z6831 Body mass index (BMI) 31.0-31.9, adult: Secondary | ICD-10-CM | POA: Diagnosis not present

## 2024-04-24 DIAGNOSIS — M79642 Pain in left hand: Secondary | ICD-10-CM | POA: Diagnosis not present

## 2024-04-24 DIAGNOSIS — E669 Obesity, unspecified: Secondary | ICD-10-CM | POA: Diagnosis not present

## 2024-04-24 DIAGNOSIS — M5459 Other low back pain: Secondary | ICD-10-CM | POA: Diagnosis not present

## 2024-04-24 DIAGNOSIS — M79671 Pain in right foot: Secondary | ICD-10-CM | POA: Diagnosis not present

## 2024-04-24 DIAGNOSIS — M79641 Pain in right hand: Secondary | ICD-10-CM | POA: Diagnosis not present

## 2024-04-24 DIAGNOSIS — R5383 Other fatigue: Secondary | ICD-10-CM | POA: Diagnosis not present

## 2024-04-24 DIAGNOSIS — K121 Other forms of stomatitis: Secondary | ICD-10-CM | POA: Diagnosis not present

## 2024-04-24 MED ORDER — COLCHICINE 0.6 MG PO TABS
0.6000 mg | ORAL_TABLET | Freq: Two times a day (BID) | ORAL | 1 refills | Status: AC
  Filled 2024-04-24: qty 60, 30d supply, fill #0

## 2024-04-24 MED ORDER — CELECOXIB 200 MG PO CAPS
200.0000 mg | ORAL_CAPSULE | Freq: Two times a day (BID) | ORAL | 3 refills | Status: AC
  Filled 2024-04-24: qty 60, 30d supply, fill #0
  Filled 2024-06-03: qty 60, 30d supply, fill #1
  Filled 2024-06-27: qty 60, 30d supply, fill #2

## 2024-04-29 ENCOUNTER — Other Ambulatory Visit (HOSPITAL_COMMUNITY): Payer: Self-pay

## 2024-04-30 ENCOUNTER — Other Ambulatory Visit: Payer: Self-pay

## 2024-05-03 ENCOUNTER — Other Ambulatory Visit (HOSPITAL_COMMUNITY): Payer: Self-pay

## 2024-05-03 ENCOUNTER — Other Ambulatory Visit: Payer: Self-pay

## 2024-05-03 MED ORDER — BUPROPION HCL ER (XL) 150 MG PO TB24
450.0000 mg | ORAL_TABLET | Freq: Every day | ORAL | 3 refills | Status: AC
  Filled 2024-05-03: qty 270, 90d supply, fill #0

## 2024-05-03 MED ORDER — BUDESONIDE-FORMOTEROL FUMARATE 160-4.5 MCG/ACT IN AERO
2.0000 | INHALATION_SPRAY | Freq: Two times a day (BID) | RESPIRATORY_TRACT | 5 refills | Status: AC
  Filled 2024-05-03: qty 10.2, 30d supply, fill #0
  Filled 2024-06-27: qty 10.2, 30d supply, fill #1

## 2024-05-07 ENCOUNTER — Other Ambulatory Visit (HOSPITAL_COMMUNITY): Payer: Self-pay

## 2024-05-16 ENCOUNTER — Other Ambulatory Visit: Payer: Self-pay

## 2024-05-16 ENCOUNTER — Other Ambulatory Visit (HOSPITAL_COMMUNITY): Payer: Self-pay

## 2024-05-17 ENCOUNTER — Other Ambulatory Visit: Payer: Self-pay

## 2024-05-17 ENCOUNTER — Other Ambulatory Visit (HOSPITAL_COMMUNITY): Payer: Self-pay

## 2024-05-17 MED ORDER — LEVOTHYROXINE SODIUM 150 MCG PO TABS
150.0000 ug | ORAL_TABLET | Freq: Every morning | ORAL | 3 refills | Status: AC
  Filled 2024-05-17: qty 90, 90d supply, fill #0

## 2024-05-17 MED ORDER — FLUCONAZOLE 150 MG PO TABS
ORAL_TABLET | ORAL | 1 refills | Status: DC
  Filled 2024-05-17: qty 2, 3d supply, fill #0

## 2024-05-17 MED ORDER — TOPIRAMATE 50 MG PO TABS
150.0000 mg | ORAL_TABLET | Freq: Every day | ORAL | 3 refills | Status: AC
  Filled 2024-05-17: qty 270, 90d supply, fill #0

## 2024-05-22 ENCOUNTER — Other Ambulatory Visit (HOSPITAL_COMMUNITY): Payer: Self-pay

## 2024-05-22 DIAGNOSIS — J301 Allergic rhinitis due to pollen: Secondary | ICD-10-CM | POA: Diagnosis not present

## 2024-05-22 DIAGNOSIS — H1045 Other chronic allergic conjunctivitis: Secondary | ICD-10-CM | POA: Diagnosis not present

## 2024-05-22 DIAGNOSIS — J3089 Other allergic rhinitis: Secondary | ICD-10-CM | POA: Diagnosis not present

## 2024-05-22 DIAGNOSIS — J4540 Moderate persistent asthma, uncomplicated: Secondary | ICD-10-CM | POA: Diagnosis not present

## 2024-05-22 MED ORDER — PREDNISONE 10 MG (48) PO TBPK
ORAL_TABLET | ORAL | 0 refills | Status: AC
  Filled 2024-05-22: qty 48, 12d supply, fill #0

## 2024-05-22 MED ORDER — AZITHROMYCIN 250 MG PO TABS
ORAL_TABLET | ORAL | 0 refills | Status: AC
  Filled 2024-05-22: qty 6, 5d supply, fill #0

## 2024-05-23 ENCOUNTER — Other Ambulatory Visit (HOSPITAL_COMMUNITY): Payer: Self-pay

## 2024-05-23 DIAGNOSIS — Z6831 Body mass index (BMI) 31.0-31.9, adult: Secondary | ICD-10-CM | POA: Diagnosis not present

## 2024-05-23 DIAGNOSIS — M79642 Pain in left hand: Secondary | ICD-10-CM | POA: Diagnosis not present

## 2024-05-23 DIAGNOSIS — K121 Other forms of stomatitis: Secondary | ICD-10-CM | POA: Diagnosis not present

## 2024-05-23 DIAGNOSIS — M1991 Primary osteoarthritis, unspecified site: Secondary | ICD-10-CM | POA: Diagnosis not present

## 2024-05-23 DIAGNOSIS — R7689 Other specified abnormal immunological findings in serum: Secondary | ICD-10-CM | POA: Diagnosis not present

## 2024-05-23 DIAGNOSIS — E669 Obesity, unspecified: Secondary | ICD-10-CM | POA: Diagnosis not present

## 2024-05-23 DIAGNOSIS — M5459 Other low back pain: Secondary | ICD-10-CM | POA: Diagnosis not present

## 2024-05-23 MED ORDER — CHLORHEXIDINE GLUCONATE 0.12 % MT SOLN
15.0000 mL | Freq: Two times a day (BID) | OROMUCOSAL | 3 refills | Status: AC
  Filled 2024-05-23: qty 946, 32d supply, fill #0

## 2024-05-27 ENCOUNTER — Other Ambulatory Visit (HOSPITAL_COMMUNITY): Payer: Self-pay

## 2024-05-29 DIAGNOSIS — D367 Benign neoplasm of other specified sites: Secondary | ICD-10-CM | POA: Diagnosis not present

## 2024-05-29 DIAGNOSIS — D224 Melanocytic nevi of scalp and neck: Secondary | ICD-10-CM | POA: Diagnosis not present

## 2024-06-03 ENCOUNTER — Other Ambulatory Visit (HOSPITAL_COMMUNITY): Payer: Self-pay

## 2024-06-04 ENCOUNTER — Other Ambulatory Visit (HOSPITAL_COMMUNITY): Payer: Self-pay

## 2024-06-25 ENCOUNTER — Other Ambulatory Visit: Payer: Self-pay

## 2024-06-27 ENCOUNTER — Other Ambulatory Visit: Payer: Self-pay

## 2024-06-27 ENCOUNTER — Other Ambulatory Visit (HOSPITAL_COMMUNITY): Payer: Self-pay

## 2024-07-03 ENCOUNTER — Other Ambulatory Visit (HOSPITAL_COMMUNITY): Payer: Self-pay

## 2024-07-10 ENCOUNTER — Other Ambulatory Visit: Payer: Self-pay

## 2024-07-11 ENCOUNTER — Other Ambulatory Visit (HOSPITAL_COMMUNITY): Payer: Self-pay

## 2024-07-14 ENCOUNTER — Other Ambulatory Visit: Payer: Self-pay

## 2024-07-14 ENCOUNTER — Other Ambulatory Visit (HOSPITAL_COMMUNITY): Payer: Self-pay

## 2024-07-14 MED ORDER — CYCLOBENZAPRINE HCL 5 MG PO TABS
5.0000 mg | ORAL_TABLET | ORAL | 2 refills | Status: AC | PRN
  Filled 2024-07-14: qty 180, 90d supply, fill #0

## 2024-07-15 ENCOUNTER — Other Ambulatory Visit (HOSPITAL_COMMUNITY): Payer: Self-pay

## 2024-07-18 ENCOUNTER — Other Ambulatory Visit (HOSPITAL_COMMUNITY): Payer: Self-pay

## 2024-07-18 MED ORDER — FLUCONAZOLE 150 MG PO TABS
150.0000 mg | ORAL_TABLET | ORAL | 5 refills | Status: AC
  Filled 2024-07-18: qty 2, 3d supply, fill #0

## 2024-07-18 MED ORDER — HYDROCODONE-ACETAMINOPHEN 7.5-325 MG PO TABS: 1.0000 | ORAL_TABLET | Freq: Four times a day (QID) | ORAL | 0 refills | Status: AC | PRN

## 2024-07-18 MED ORDER — DIAZEPAM 10 MG PO TABS: 10.0000 mg | ORAL_TABLET | Freq: Every day | ORAL | 5 refills | Status: AC

## 2024-07-18 MED ORDER — ONDANSETRON 8 MG PO TBDP
8.0000 mg | ORAL_TABLET | Freq: Three times a day (TID) | ORAL | 12 refills | Status: AC | PRN
  Filled 2024-07-18: qty 30, 10d supply, fill #0

## 2024-08-09 ENCOUNTER — Ambulatory Visit: Admitting: Rheumatology

## 2024-09-03 ENCOUNTER — Ambulatory Visit: Admitting: Rheumatology
